# Patient Record
Sex: Male | Born: 1970 | Race: White | Hispanic: No | Marital: Married | Smoking: Former smoker
Health system: Southern US, Community
[De-identification: ages and names within clinical notes are randomized; demographics above are authoritative.]

## PROBLEM LIST (undated history)

## (undated) DIAGNOSIS — I10 Essential (primary) hypertension: Secondary | ICD-10-CM

## (undated) DIAGNOSIS — K222 Esophageal obstruction: Secondary | ICD-10-CM

## (undated) DIAGNOSIS — R1310 Dysphagia, unspecified: Secondary | ICD-10-CM

## (undated) DIAGNOSIS — I4510 Unspecified right bundle-branch block: Secondary | ICD-10-CM

## (undated) DIAGNOSIS — F411 Generalized anxiety disorder: Secondary | ICD-10-CM

## (undated) DIAGNOSIS — M109 Gout, unspecified: Secondary | ICD-10-CM

## (undated) DIAGNOSIS — J45909 Unspecified asthma, uncomplicated: Secondary | ICD-10-CM

## (undated) HISTORY — DX: Gout, unspecified: M10.9

## (undated) HISTORY — DX: Dysphagia, unspecified: R13.10

## (undated) HISTORY — DX: Generalized anxiety disorder: F41.1

## (undated) HISTORY — PX: UPPER GI ENDOSCOPY: SHX6162

## (undated) HISTORY — PX: TONSILLECTOMY: SUR1361

## (undated) HISTORY — DX: Unspecified asthma, uncomplicated: J45.909

## (undated) HISTORY — PX: ESOPHAGEAL DILATION: SHX303

## (undated) HISTORY — DX: Unspecified right bundle-branch block: I45.10

---

## 2002-05-29 ENCOUNTER — Ambulatory Visit (HOSPITAL_COMMUNITY): Admission: RE | Admit: 2002-05-29 | Discharge: 2002-05-29 | Payer: Self-pay | Admitting: Gastroenterology

## 2003-03-07 ENCOUNTER — Emergency Department (HOSPITAL_COMMUNITY): Admission: EM | Admit: 2003-03-07 | Discharge: 2003-03-07 | Payer: Self-pay | Admitting: Emergency Medicine

## 2003-03-16 ENCOUNTER — Emergency Department (HOSPITAL_COMMUNITY): Admission: EM | Admit: 2003-03-16 | Discharge: 2003-03-16 | Payer: Self-pay | Admitting: *Deleted

## 2005-05-23 ENCOUNTER — Ambulatory Visit (HOSPITAL_COMMUNITY): Admission: RE | Admit: 2005-05-23 | Discharge: 2005-05-23 | Payer: Self-pay | Admitting: Gastroenterology

## 2005-05-30 ENCOUNTER — Encounter: Admission: RE | Admit: 2005-05-30 | Discharge: 2005-08-28 | Payer: Self-pay | Admitting: Family Medicine

## 2011-02-01 ENCOUNTER — Other Ambulatory Visit: Payer: Self-pay | Admitting: Gastroenterology

## 2011-02-01 DIAGNOSIS — R1031 Right lower quadrant pain: Secondary | ICD-10-CM

## 2011-02-06 ENCOUNTER — Ambulatory Visit
Admission: RE | Admit: 2011-02-06 | Discharge: 2011-02-06 | Disposition: A | Discharge status: Home / Self Care | Payer: 59 | Source: Ambulatory Visit | Attending: Gastroenterology | Admitting: Gastroenterology

## 2011-02-06 DIAGNOSIS — R1031 Right lower quadrant pain: Secondary | ICD-10-CM

## 2011-10-05 ENCOUNTER — Emergency Department (HOSPITAL_COMMUNITY)
Admission: EM | Admit: 2011-10-05 | Discharge: 2011-10-05 | Discharge status: Against Medical Advice | Payer: 59 | Attending: Emergency Medicine | Admitting: Emergency Medicine

## 2011-10-05 DIAGNOSIS — I10 Essential (primary) hypertension: Secondary | ICD-10-CM | POA: Insufficient documentation

## 2011-10-05 DIAGNOSIS — R079 Chest pain, unspecified: Secondary | ICD-10-CM | POA: Insufficient documentation

## 2011-10-05 LAB — CBC
HCT: 43.2 % (ref 39.0–52.0)
Platelets: 226 10*3/uL (ref 150–400)
RBC: 4.82 MIL/uL (ref 4.22–5.81)
RDW: 12.5 % (ref 11.5–15.5)
WBC: 6 10*3/uL (ref 4.0–10.5)

## 2011-10-05 LAB — DIFFERENTIAL
Basophils Absolute: 0 10*3/uL (ref 0.0–0.1)
Eosinophils Relative: 2 % (ref 0–5)
Lymphocytes Relative: 27 % (ref 12–46)
Neutrophils Relative %: 62 % (ref 43–77)

## 2011-10-05 LAB — POCT I-STAT TROPONIN I
Troponin i, poc: 0 ng/mL (ref 0.00–0.08)
Troponin i, poc: 0 ng/mL (ref 0.00–0.08)

## 2011-10-05 LAB — COMPREHENSIVE METABOLIC PANEL
ALT: 70 U/L — ABNORMAL HIGH (ref 0–53)
BUN: 14 mg/dL (ref 6–23)
CO2: 27 mEq/L (ref 19–32)
Calcium: 9.6 mg/dL (ref 8.4–10.5)
Creatinine, Ser: 0.89 mg/dL (ref 0.50–1.35)
GFR calc Af Amer: >90 mL/min (ref >90)
GFR calc non Af Amer: >90 mL/min (ref >90)
Glucose, Bld: 92 mg/dL (ref 70–99)
Sodium: 134 mEq/L — ABNORMAL LOW (ref 135–145)
Total Protein: 7.9 g/dL (ref 6.0–8.3)

## 2011-10-06 ENCOUNTER — Other Ambulatory Visit: Payer: Self-pay | Admitting: Family Medicine

## 2011-10-11 ENCOUNTER — Ambulatory Visit
Admission: RE | Admit: 2011-10-11 | Discharge: 2011-10-11 | Disposition: A | Discharge status: Home / Self Care | Payer: 59 | Source: Ambulatory Visit | Attending: Family Medicine | Admitting: Family Medicine

## 2012-03-13 ENCOUNTER — Ambulatory Visit
Admission: RE | Admit: 2012-03-13 | Discharge: 2012-03-13 | Disposition: A | Discharge status: Home / Self Care | Payer: 59 | Source: Ambulatory Visit | Attending: Allergy | Admitting: Allergy

## 2012-03-13 ENCOUNTER — Other Ambulatory Visit: Payer: Self-pay | Admitting: Allergy

## 2012-03-13 DIAGNOSIS — R062 Wheezing: Secondary | ICD-10-CM

## 2013-06-06 ENCOUNTER — Emergency Department (HOSPITAL_COMMUNITY)
Admission: EM | Admit: 2013-06-06 | Discharge: 2013-06-06 | Disposition: A | Discharge status: Home / Self Care | Payer: 59 | Attending: Emergency Medicine | Admitting: Emergency Medicine

## 2013-06-06 ENCOUNTER — Emergency Department (HOSPITAL_COMMUNITY): Payer: 59

## 2013-06-06 ENCOUNTER — Encounter (HOSPITAL_COMMUNITY): Payer: Self-pay | Admitting: *Deleted

## 2013-06-06 DIAGNOSIS — Y9289 Other specified places as the place of occurrence of the external cause: Secondary | ICD-10-CM | POA: Insufficient documentation

## 2013-06-06 DIAGNOSIS — I10 Essential (primary) hypertension: Secondary | ICD-10-CM | POA: Insufficient documentation

## 2013-06-06 DIAGNOSIS — X500XXA Overexertion from strenuous movement or load, initial encounter: Secondary | ICD-10-CM | POA: Insufficient documentation

## 2013-06-06 DIAGNOSIS — S93602A Unspecified sprain of left foot, initial encounter: Secondary | ICD-10-CM

## 2013-06-06 DIAGNOSIS — Y9389 Activity, other specified: Secondary | ICD-10-CM | POA: Insufficient documentation

## 2013-06-06 DIAGNOSIS — Z79899 Other long term (current) drug therapy: Secondary | ICD-10-CM | POA: Insufficient documentation

## 2013-06-06 DIAGNOSIS — Z8719 Personal history of other diseases of the digestive system: Secondary | ICD-10-CM | POA: Insufficient documentation

## 2013-06-06 DIAGNOSIS — Z880 Allergy status to penicillin: Secondary | ICD-10-CM | POA: Insufficient documentation

## 2013-06-06 DIAGNOSIS — S93609A Unspecified sprain of unspecified foot, initial encounter: Secondary | ICD-10-CM | POA: Insufficient documentation

## 2013-06-06 DIAGNOSIS — Y990 Civilian activity done for income or pay: Secondary | ICD-10-CM | POA: Insufficient documentation

## 2013-06-06 HISTORY — DX: Esophageal obstruction: K22.2

## 2013-06-06 HISTORY — DX: Essential (primary) hypertension: I10

## 2013-06-06 MED ORDER — HYDROCODONE-ACETAMINOPHEN 5-325 MG PO TABS: 1.0000 | ORAL_TABLET | Freq: Four times a day (QID) | ORAL | Status: DC | PRN

## 2013-06-06 MED ORDER — IBUPROFEN 800 MG PO TABS
800.0000 mg | ORAL_TABLET | Freq: Once | ORAL | Status: AC
  Administered 2013-06-06: 800 mg via ORAL
  Filled 2013-06-06: qty 1

## 2013-06-06 MED ORDER — IBUPROFEN 800 MG PO TABS: 800.0000 mg | ORAL_TABLET | Freq: Three times a day (TID) | ORAL | Status: DC

## 2013-06-06 NOTE — ED Provider Notes (Signed)
History     CSN: [id number]  Arrival date & time 06/06/13  0415   First MD Initiated Contact with Patient 06/06/13 0505      Chief Complaint  Patient presents with  . Foot Pain    (Consider location/radiation/quality/duration/timing/severity/associated sxs/prior treatment) HPI Hx per PT - at work, was leaning over to pick something up and felt a pop in his left foot, about an hour later it started to hurt some and since then getting progressively worse now to the point of hurts to bear weight.  PT worried he may have sprained it. There is some swelling. No knee or ankle pain or injury otherwise. Pain sharp and mod in severity. He took Ibuprofen with minimal relief.  Past Medical History  Diagnosis Date  . Hypertension   . Esophageal stricture     History reviewed. No pertinent past surgical history.  No family history on file.  History  Substance Use Topics  . Smoking status: Never Smoker   . Smokeless tobacco: Not on file  . Alcohol Use: 1.8 oz/week    3 Cans of beer per week      Review of Systems  Constitutional: Negative for fever and chills.  HENT: Negative for neck pain and neck stiffness.   Eyes: Negative for pain.  Respiratory: Negative for shortness of breath.   Cardiovascular: Negative for chest pain.  Gastrointestinal: Negative for abdominal pain.  Genitourinary: Negative for dysuria.  Musculoskeletal: Negative for back pain.  Skin: Negative for rash.  Neurological: Negative for headaches.  All other systems reviewed and are negative.    Allergies  Penicillins  Home Medications   Current Outpatient Rx  Name  Route  Sig  Dispense  Refill  . ibuprofen (ADVIL,MOTRIN) 200 MG tablet   Oral   Take 800 mg by mouth every 6 (six) hours as needed for pain.         Marland Kitchen omeprazole (PRILOSEC) 20 MG capsule   Oral   Take 20 mg by mouth daily.           BP 146/88  Pulse 89  Temp(Src) 99.7 F (37.6 C)  Resp 20  SpO2 99%  Physical Exam   Constitutional: He is oriented to person, place, and time. He appears well-developed and well-nourished.  HENT:  Head: Normocephalic and atraumatic.  Eyes: EOM are normal. Pupils are equal, round, and reactive to light.  Neck: Neck supple.  Cardiovascular: Regular rhythm and intact distal pulses.   Pulmonary/Chest: Effort normal. No respiratory distress.  Musculoskeletal:  L foot TTP dorsal lateral aspect with moderate edema, no erythema or inc warmth, no tenderness over ankle or proximal fibula. distal pulses and N/V intact  Neurological: He is alert and oriented to person, place, and time.  Skin: Skin is warm and dry.    ED Course  Procedures (including critical care time)  Labs Reviewed - No data to display Dg Foot Complete Left  06/06/2013   *RADIOLOGY REPORT*  Clinical Data: Left proximal and dorsal foot pain.  No specific injury but heard something pop around midnight.  Pain since then.  LEFT FOOT - COMPLETE 3+ VIEW  Comparison: None.  Findings: Mild degenerative changes in the tarsal joints.  Left foot appears otherwise intact. No evidence of acute fracture or subluxation.  No focal bone lesions.  Bone matrix and cortex appear intact.  No abnormal radiopaque densities in the soft tissues. Small plantar calcaneal spur.  IMPRESSION: No acute bony abnormalities demonstrated the left foot.  Original Report Authenticated By: Burman Nieves, M.D.    Ice, elevation of LLE  No FX on xray - plan splint/ crutches and Ortho referral. Sprain and occult FX precautions provided.   MDM  L foot injury/ pain  Evaluated with xray reviewed as above  VS and nursing notes reviewed        Sunnie Nielsen, MD 06/06/13 [id number]

## 2013-06-06 NOTE — ED Notes (Signed)
Pt c/o left foot pain; leaned down at work and heard a pop sound; no pain at that time; developed more and more pain gradually since midnight; pain on top of foot radiating to heel; severe pain with walking

## 2014-02-02 ENCOUNTER — Encounter: Payer: Self-pay | Admitting: Podiatry

## 2014-02-02 ENCOUNTER — Ambulatory Visit (INDEPENDENT_AMBULATORY_CARE_PROVIDER_SITE_OTHER): Payer: 59 | Admitting: Podiatry

## 2014-02-02 ENCOUNTER — Ambulatory Visit (INDEPENDENT_AMBULATORY_CARE_PROVIDER_SITE_OTHER): Payer: 59

## 2014-02-02 VITALS — BP 159/102 | HR 79 | Resp 16 | Ht 75.0 in | Wt 250.0 lb

## 2014-02-02 DIAGNOSIS — M722 Plantar fascial fibromatosis: Secondary | ICD-10-CM

## 2014-02-02 DIAGNOSIS — M109 Gout, unspecified: Secondary | ICD-10-CM

## 2014-02-02 MED ORDER — TRIAMCINOLONE ACETONIDE 10 MG/ML IJ SUSP: 10.0000 mg | Freq: Once | INTRAMUSCULAR | Status: DC

## 2014-02-02 MED ORDER — DICLOFENAC SODIUM 75 MG PO TBEC: 75.0000 mg | DELAYED_RELEASE_TABLET | Freq: Two times a day (BID) | ORAL | Status: DC

## 2014-02-02 NOTE — Progress Notes (Signed)
Subjective:     Patient ID: Alexander Joseph, male   DOB: Feb 07, 1971, 43 y.o.   MRN: [id number]  Foot Pain   patient presents stating I'm having discomfort of a significant nature in the bottom of my right heel. I had a history of gout and I'm not sure if it to that but I did try medicine which did not help me and the pain is worse when I get up in the morning and after periods of sitting and has been lasting for at least 1 month   Review of Systems  All other systems reviewed and are negative.       Objective:   Physical Exam  Nursing note and vitals reviewed. Constitutional: He is oriented to person, place, and time.  Cardiovascular: Intact distal pulses.   Musculoskeletal: Normal range of motion.  Neurological: He is oriented to person, place, and time.  Skin: Skin is warm.   neurovascular status is intact with muscle strength adequate and mild equinus noted of both feet. Patient's not have discomfort plantar heel right at the insertional point of the tendon into the calcaneus with no erythema or edema present and no other joint swelling noted at this time     Assessment:     Probable plantar fasciitis right with no current involvement of gout    Plan:     H&P and x-rays reviewed. Reviewed doubt versus plantar fasciitis and today injected the plantar fascia 3 mg Kenalog 5 mg I can Marcaine mixture and applied fascially brace with instructions. Placed on Voltaren 75 mg twice a day and advised him reduced activity for the next several days and reappoint one week

## 2014-02-02 NOTE — Patient Instructions (Signed)

## 2014-02-02 NOTE — Progress Notes (Signed)
   Subjective:    Patient ID: Alexander RayaDavid M Joseph, male    DOB: 1971/09/19, 43 y.o.   MRN: [id number]  HPI Comments: "I thought it was gout but now I think its something else"  Patient states that he has sharp pain plantar heel right for 3.5 weeks now. Am pain and pain after sitting for a log period. He notices that its worse at work with walking a lot. Has tried stretching, rolling foot on ball, and padding. Nothing has helped. Does feel some arch pain occasionally. Pt has history of gout.  Foot Pain Associated symptoms include fatigue.      Review of Systems  Constitutional: Positive for fatigue.  Genitourinary: Positive for frequency.  Musculoskeletal: Positive for gait problem.  Allergic/Immunologic: Positive for environmental allergies.  All other systems reviewed and are negative.       Objective:   Physical Exam        Assessment & Plan:

## 2014-02-09 ENCOUNTER — Ambulatory Visit: Payer: 59 | Admitting: Podiatry

## 2014-02-11 ENCOUNTER — Ambulatory Visit: Payer: Self-pay | Admitting: Podiatry

## 2014-08-21 ENCOUNTER — Encounter: Payer: Self-pay | Admitting: *Deleted

## 2014-09-11 ENCOUNTER — Ambulatory Visit (INDEPENDENT_AMBULATORY_CARE_PROVIDER_SITE_OTHER): Payer: 59

## 2014-09-11 VITALS — BP 160/99 | HR 72 | Resp 16

## 2014-09-11 DIAGNOSIS — S99921A Unspecified injury of right foot, initial encounter: Secondary | ICD-10-CM

## 2014-09-11 DIAGNOSIS — S99919A Unspecified injury of unspecified ankle, initial encounter: Secondary | ICD-10-CM

## 2014-09-11 DIAGNOSIS — S92911A Unspecified fracture of right toe(s), initial encounter for closed fracture: Secondary | ICD-10-CM

## 2014-09-11 DIAGNOSIS — S9031XA Contusion of right foot, initial encounter: Secondary | ICD-10-CM

## 2014-09-11 DIAGNOSIS — S99929A Unspecified injury of unspecified foot, initial encounter: Secondary | ICD-10-CM

## 2014-09-11 DIAGNOSIS — S8990XA Unspecified injury of unspecified lower leg, initial encounter: Secondary | ICD-10-CM

## 2014-09-11 DIAGNOSIS — S9030XA Contusion of unspecified foot, initial encounter: Secondary | ICD-10-CM

## 2014-09-11 DIAGNOSIS — S92919A Unspecified fracture of unspecified toe(s), initial encounter for closed fracture: Secondary | ICD-10-CM

## 2014-09-11 NOTE — Patient Instructions (Signed)
ICE INSTRUCTIONS  Apply ice or cold pack to the affected area at least 3 times a day for 10-15 minutes each time.  You should also use ice after prolonged activity or vigorous exercise.  Do not apply ice longer than 20 minutes at one time.  Always keep a cloth between your skin and the ice pack to prevent burns.  Being consistent and following these instructions will help control your symptoms.  We suggest you purchase a gel ice pack because they are reusable and do bit leak.  Some of them are designed to wrap around the area.  Use the method that works best for you.  Here are some other suggestions for icing.   Use a frozen bag of peas or corn-inexpensive and molds well to your body, usually stays frozen for 10 to 20 minutes.  Wet a towel with cold water and squeeze out the excess until it's damp.  Place in a bag in the freezer for 20 minutes. Then remove and use.  Recommended Tylenol or Advil as needed for pain  Maintain Coflex buddy wrap of toes 3,4 and 5 need to be buddy wrap. Maintain wrapping on a daily basis for the next 2-3 weeks. Also maintain a stiff soled work boot or athletic shoe at all times. Avoid anything tight or constrictive or flimsy

## 2014-09-11 NOTE — Progress Notes (Signed)
   Subjective:    Patient ID: Alexander Joseph, male    DOB: 10/09/1971, 43 y.o.   MRN: [id number]  HPI Comments: "I injured my foot"  Patient presents today with new problem.  Patient c/o throbbing forefoot right stating that he slipped and fell 2 days ago and twisted his foot. The area is slightly swollen and he does have bruising forefoot and toes. He's been trying to keep weight on medial foot. He's tried advil and elevation-no help.  Foot Injury       Review of Systems  Musculoskeletal: Positive for gait problem.  All other systems reviewed and are negative.      Objective:   Physical Exam 43 year old white male well-developed well-nourished. History presents at this time 2 days ago injured his right foot third fourth and fifth toes mostly fourth and fifth toes are some ecchymosis and edema and swelling of the toes having a hard time putting his weight has to walk and climb c and along fifth metatarsal painful and symptomatic. X-rays taken at this time reveal a fracture of the fifth digit proximal phalanx base with slight transverse displacement on oblique view adductovarus rotated fourth and fifth digits noted cannot rule out some strain or sprain of the MTP joints as well as the fracture the fifth toe for proximal phalanx base. Neurovascular status otherwise intact unremarkable no other changes since seems in February of the shear continues to have some plantar fascial symptomology as well I benefit from orthoses in the future. Again neurovascular status intact pedal pulses palpable no open wounds or ulcers no secondary infection       Assessment & Plan:  Assessment contusion of toe with fracture fifth digit proximal fail buddy wrap is applied and multiple wraps are dispensed for daily application maintain buddy wrap in for the next 2-4 weeks as indicated also stiff shoe ice pack and Tylenol as needed for pain followup has not resolved within 4-6 weeks  Alvan Dame DPM

## 2014-09-14 ENCOUNTER — Ambulatory Visit: Payer: 59

## 2014-09-16 ENCOUNTER — Ambulatory Visit (INDEPENDENT_AMBULATORY_CARE_PROVIDER_SITE_OTHER): Payer: 59

## 2014-09-16 VITALS — BP 179/111 | HR 95 | Resp 16

## 2014-09-16 DIAGNOSIS — M109 Gout, unspecified: Secondary | ICD-10-CM

## 2014-09-16 DIAGNOSIS — M779 Enthesopathy, unspecified: Secondary | ICD-10-CM

## 2014-09-16 LAB — CBC WITH DIFFERENTIAL/PLATELET
BASOS ABS: 0.1 10*3/uL (ref 0.0–0.1)
Basophils Relative: 1 % (ref 0–1)
EOS ABS: 0.1 10*3/uL (ref 0.0–0.7)
Eosinophils Relative: 1 % (ref 0–5)
HCT: 46 % (ref 39.0–52.0)
Hemoglobin: 16.5 g/dL (ref 13.0–17.0)
LYMPHS ABS: 1.6 10*3/uL (ref 0.7–4.0)
LYMPHS PCT: 27 % (ref 12–46)
MCH: 31.9 pg (ref 26.0–34.0)
MCHC: 35.9 g/dL (ref 30.0–36.0)
MCV: 89 fL (ref 78.0–100.0)
Monocytes Absolute: 0.6 10*3/uL (ref 0.1–1.0)
Monocytes Relative: 10 % (ref 3–12)
NEUTROS PCT: 61 % (ref 43–77)
Neutro Abs: 3.7 10*3/uL (ref 1.7–7.7)
PLATELETS: 208 10*3/uL (ref 150–400)
RBC: 5.17 MIL/uL (ref 4.22–5.81)
RDW: 12.8 % (ref 11.5–15.5)
WBC: 6 10*3/uL (ref 4.0–10.5)

## 2014-09-16 LAB — RHEUMATOID FACTOR

## 2014-09-16 LAB — C-REACTIVE PROTEIN: CRP: <0.5 mg/dL (ref <0.60)

## 2014-09-16 LAB — URIC ACID: Uric Acid, Serum: 9.1 mg/dL — ABNORMAL HIGH (ref 4.0–7.8)

## 2014-09-16 MED ORDER — COLCHICINE 0.6 MG PO TABS: ORAL_TABLET | ORAL | Status: DC

## 2014-09-16 NOTE — Addendum Note (Signed)
Addended by: Kristian Covey on: 09/16/2014 02:53 PM   Modules accepted: Orders

## 2014-09-16 NOTE — Progress Notes (Signed)
   Subjective:    Patient ID: Alexander Joseph, male    DOB: [birth year], 43 y.o.   MRN: [id number]  HPI Comments: "I have a lot pain in this joint"  Patient c/o aching, throbbing 1st MPJ right for 2 days. He wondered if this was still something coming from the previous injury being treated for. The area is red and swollen. He is not able to put pressure on foot. Has not been able to work. He does have a history of gout. He has tried Advil-no relief.  Foot Pain Associated symptoms include arthralgias.      Review of Systems  Musculoskeletal: Positive for arthralgias and gait problem.  All other systems reviewed and are negative.      Objective:   Physical Exam 43 year old white male well-developed well-nourished oriented x3 presents at this time with a new problem last couple days his right great toe the painful symptomatic is not sure to compensatory gait orifice a gout flareup has had gout in the past previously treated with colchicine however he has no medication at current time. Doesn't keep it he's had some diarrhea possibly a stomach bug some sort last couple of days which may cause dehydration. Also been applying ice pack to the fracture fifth toe which may be loosening a gout attack as well.  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 Levaquin for arthritis panel and uric acid levels will be taken recheck in the next week or 2 for reevaluation contact us visiting changes or  exacerbations occur any failure to improve in the next couple days  Alvan Dame DPM

## 2014-09-16 NOTE — Patient Instructions (Signed)

## 2014-09-17 LAB — SEDIMENTATION RATE: SED RATE: 1 mm/h (ref 0–16)

## 2014-09-17 LAB — ANA: Anti Nuclear Antibody(ANA): NEGATIVE

## 2014-10-06 ENCOUNTER — Ambulatory Visit (INDEPENDENT_AMBULATORY_CARE_PROVIDER_SITE_OTHER): Payer: 59

## 2014-10-06 VITALS — BP 171/97 | HR 72 | Resp 15

## 2014-10-06 DIAGNOSIS — M109 Gout, unspecified: Secondary | ICD-10-CM

## 2014-10-06 DIAGNOSIS — M1000 Idiopathic gout, unspecified site: Secondary | ICD-10-CM

## 2014-10-06 DIAGNOSIS — S92911D Unspecified fracture of right toe(s), subsequent encounter for fracture with routine healing: Secondary | ICD-10-CM

## 2014-10-06 DIAGNOSIS — M779 Enthesopathy, unspecified: Secondary | ICD-10-CM

## 2014-10-06 NOTE — Patient Instructions (Signed)
Information for patients with Gout  Gout defined-Gout occurs when urate crystals accumulate in your joint causing the inflammation and intense pain of gout attack.  Urate crystals can form when you have high levels of uric acid in your blood.  Your body produces uric acid when it breaks down prurines-substances that are found naturally in your body, as well as in certain foods such as organ meats, anchioves, herring, asparagus, and mushrooms.  Normally uric acid dissolves in your blood and passes through your kidneys into your urine.  But sometimes your body either produces too much uric acid or your kidneys excrete too little uric acid.  When this happens, uric acid can build up, forming sharp needle-like urate crystals in a joint or surrounding tissue that cause pain, inflammation and swelling.    Gout is characterized by sudden, severe attacks of pain, redness and tenderness in joints, often the joint at the base of the big toe.  Gout is complex form of arthritis that can affect anyone.  Men are more likely to get gout but women become increasingly more susceptible to gout after menopause.  An acute attack of gout can wake you up in the middle of the night with the sensation that your big toe is on fire.  The affected joint is hot, swollen and so tender that even the weight or the sheet on it may seem intolerable.  If you experience symptoms of an acute gout attack it is important to your doctor as soon as the symptoms start.  Gout that goes untreated can lead to worsening pain and joint damage.  Risk Factors:  You are more likely to develop gout if you have high levels of uric acid in your body.    Factors that increase the uric acid level in your body include:  Lifestyle factors.  Excessive alcohol use-generally more than two drinks a day for men and more than one for women increase the risk of gout.  Medical conditions.  Certain conditions make it more likely that you will develop gout.   These include hypertension, and chronic conditions such as diabetes, high levels of fat and cholesterol in the blood, and narrowing of the arteries.  Certain medications.  The uses of Thiazide diuretics- commonly used to treat hypertension and low dose aspirin can also increase uric acid levels.  Family history of gout.  If other members of your family have had gout, you are more likely to develop the disease.  Age and sex. Gout occurs more often in men than it does in women, primarily because women tend to have lower uric acid levels than men do.  Men are more likely to develop gout earlier usually between the ages of 40-50- whereas women generally develop signs and symptoms after menopause.    Tests and diagnosis:  Tests to help diagnose gout may include:  Blood test.  Your doctor may recommend a blood test to measure the uric acid level in your blood .  Blood tests can be misleading, though.  Some people have high uric acid levels but never experience gout.  And some people have signs and symptoms of gout, but don't have unusual levels of uric acid in their blood.  Joint fluid test.  Your doctor may use a needle to draw fluid from your affected joint.  When examined under the microscope, your joint fluid may reveal urate crystals.  Treatment:  Treatment for gout usually involves medications.  What medications you and your doctor choose will be   based on your current health and other medications you currently take.  Gout medications can be used to treat acute gout attacks and prevent future attacks as well as reduce your risk of complications from gout such as the development of tophi from urate crystal deposits.  Alternative medicine:   Certain foods have been studied for their potential to lower uric acid levels, including:  Coffee.  Studies have found an association between coffee drinking (regular and decaf) and lower uric acid levels.  The evidence is not enough to encourage  non-coffee drinkers to start, but it may give clues to new ways of treating gout in the future.  Vitamin C.  Supplements containing vitamin C may reduce the levels of uric acid in your blood.  However, vitamin as a treatment for gout. Don't assume that if a little vitamin C is good, than lots is better.  Megadoses of vitamin C may increase your bodies uric acid levels.  Cherries.  Cherries have been associated with lower levels of uric acid in studies, but it isn't clear if they have any effect on gout signs and symptoms.  Eating more cherries and other dar-colored fruits, such as blackberries, blueberries, purple grapes and raspberries, may be a safe way to support your gout treatment.    Lifestyle/Diet Recommendations:   Drink 8 to 16 cups ( about 2 to 4 liters) of fluid each day, with at least half being water.  Avoid alcohol  Eat a moderate amount of protein, preferably from healthy sources, such as low-fat or fat-free dairy, tofu, eggs, and nut butters.  Limit you daily intake of meat, fish, and poultry to 4 to 6 ounces.  Avoid high fat meats and desserts.  Decrease you intake of shellfish, beef, lamb, pork, eggs and cheese.  Choose a good source of vitamin C daily such as citrus fruits, strawberries, broccoli,  brussel sprouts, papaya, and cantaloupe.   Choose a good source of vitamin A every other day such as yellow fruits, or dark green/yellow vegetables.  Avoid drastic weigh reduction or fasting.  If weigh loss is desired lose it over a period of several months.  See "dietary considerations.." chart for specific food recommendations.  Dietary Considerations for people with Gout  Food with negligible purine content (0-15 mg of purine nitrogen per 100 grams food)  May use as desired except on calorie variations  Non fat milk Cocoa Cereals (except in list II) Hard candies  Buttermilk Carbonated drinks Vegetables (except in list II) Sherbet  Coffee Fruits Sugar Honey  Tea  Cottage Cheese Gelatin-jell-o Salt  Fruit juice Breads Angel food Cake   Herbs/spices Jams/Jellies Carnation Instant Breakfast    Foods that do not contain excessive purine content, but must be limited due to fat content  Cream Eggs Oil and Salad Dressing  Half and Half Peanut Butter Chocolate  Whole Milk Cakes Potato Chips  Butter Ice Cream Fried Foods  Cheese Nuts Waffles, pancakes   List II: Food with moderate purine content (50-150 mg of purine nitrogen per 100 grams of food)  Limit total amount each day to 5 oz. cooked Lean meat, other than those on list III   Poultry, other than those on list III Fish, other than those on list III   Seafood, other than those on list III  These foods may be used occasionally  Peas Lentils Bran  Spinach Oatmeal Dried Beans and Peas  Asparagus Wheat Germ Mushrooms   Additional information about meat choices  Choose fish   and poultry, particularly without skin, often.  Select lean, well trimmed cuts of meat.  Avoid all fatty meats, bacon , sausage, fried meats, fried fish, or poultry, luncheon meats, cold cuts, hot dogs, meats canned or frozen in gravy, spareribs and frozen and packaged prepared meats.   List III: Foods with HIGH purine content / Foods to AVOID (150-800 mg of purine nitrogen per 100 grams of food)  Anchovies Herring Meat Broths  Liver Mackerel Meat Extracts  Kidney Scallops Meat Drippings  Sardines Wild Game Mincemeat  Sweetbreads Goose Gravy  Heart Tongue Yeast, baker's and brewers   Commercial soups made with any of the foods listed in List II or List III  In addition avoid all alcoholic beverages     Recommended discuss with her primary physician preventive medications such as Uloric or allopurinol. These medicines can help lower your uric acid level and prevent a gout attack, rather than colchicine was just reduces the pain level to

## 2014-10-06 NOTE — Progress Notes (Signed)
   Subjective:    Patient ID: Alexander Joseph, male    DOB: [birth year], 43 y.o.   MRN: [id number]  HPI Comments: Pt states he is some better, and would like to know his lab results.     Review of Systems no new findings or systemic changes noted labs reviewed and reveals elevated uric acid levels 9.1     Objective:   Physical Exam Neurovascular status is intact pedal pulses are palpable patient's lab revealed you uric acid levels 9.1 however is currently doing much better on colchicine taking one pill every other day or intermittently. Patient scheduled for followup with primary physician near future recommended that he discuss the lower or allopurinol as prevented medication. At this time patient fifth toe which her fracture appears to be doing well still some slight tenderness and swelling although not as intense as it had been several weeks ago normal crit healing of the toes noted neurovascular status intact pedal pulses are palpable no other new problem 3 shoes noted at this time.       Assessment & Plan:  Assessment resolving gout attack treated with colchicine recommended these colchicine as needed for a flareups however in the future followup with primary physician will seek definitive medication such as allopurinol or Uloric.  Followup in the future as needed  Alvan Dameichard Josemaria Brining DPM

## 2015-07-01 ENCOUNTER — Telehealth: Payer: Self-pay | Admitting: *Deleted

## 2015-07-01 NOTE — Telephone Encounter (Signed)
Pt's wife, Selena BattenKim called for refill of pt's medication for gout.  I reviewed pt's chart and pt was last seen 09/2014 by Dr. Ralene CorkSikora.  I left a message informing the wife her husband would need to be seen by a doctor prior to refill.

## 2016-02-29 ENCOUNTER — Emergency Department (HOSPITAL_COMMUNITY): Payer: 59

## 2016-02-29 ENCOUNTER — Encounter (HOSPITAL_COMMUNITY): Payer: Self-pay | Admitting: *Deleted

## 2016-02-29 DIAGNOSIS — Z87891 Personal history of nicotine dependence: Secondary | ICD-10-CM | POA: Insufficient documentation

## 2016-02-29 DIAGNOSIS — J45909 Unspecified asthma, uncomplicated: Secondary | ICD-10-CM | POA: Insufficient documentation

## 2016-02-29 DIAGNOSIS — Z8719 Personal history of other diseases of the digestive system: Secondary | ICD-10-CM | POA: Insufficient documentation

## 2016-02-29 DIAGNOSIS — R0789 Other chest pain: Secondary | ICD-10-CM | POA: Insufficient documentation

## 2016-02-29 DIAGNOSIS — Z791 Long term (current) use of non-steroidal anti-inflammatories (NSAID): Secondary | ICD-10-CM | POA: Diagnosis not present

## 2016-02-29 DIAGNOSIS — I10 Essential (primary) hypertension: Secondary | ICD-10-CM | POA: Diagnosis not present

## 2016-02-29 DIAGNOSIS — Z880 Allergy status to penicillin: Secondary | ICD-10-CM | POA: Diagnosis not present

## 2016-02-29 DIAGNOSIS — M109 Gout, unspecified: Secondary | ICD-10-CM | POA: Insufficient documentation

## 2016-02-29 DIAGNOSIS — Z8659 Personal history of other mental and behavioral disorders: Secondary | ICD-10-CM | POA: Insufficient documentation

## 2016-02-29 DIAGNOSIS — R079 Chest pain, unspecified: Secondary | ICD-10-CM | POA: Diagnosis present

## 2016-02-29 LAB — CBC
HEMATOCRIT: 43 % (ref 39.0–52.0)
HEMOGLOBIN: 15 g/dL (ref 13.0–17.0)
MCH: 31.3 pg (ref 26.0–34.0)
MCHC: 34.9 g/dL (ref 30.0–36.0)
MCV: 89.8 fL (ref 78.0–100.0)
Platelets: 198 10*3/uL (ref 150–400)
RBC: 4.79 MIL/uL (ref 4.22–5.81)
RDW: 12.7 % (ref 11.5–15.5)
WBC: 5.8 10*3/uL (ref 4.0–10.5)

## 2016-02-29 LAB — I-STAT TROPONIN, ED: Troponin i, poc: 0.01 ng/mL (ref 0.00–0.08)

## 2016-02-29 LAB — BASIC METABOLIC PANEL
ANION GAP: 11 (ref 5–15)
BUN: 10 mg/dL (ref 6–20)
CHLORIDE: 104 mmol/L (ref 101–111)
CO2: 24 mmol/L (ref 22–32)
Calcium: 9.8 mg/dL (ref 8.9–10.3)
Creatinine, Ser: 1.06 mg/dL (ref 0.61–1.24)
GFR calc non Af Amer: >60 mL/min (ref >60)
Glucose, Bld: 98 mg/dL (ref 65–99)
Potassium: 4.1 mmol/L (ref 3.5–5.1)
Sodium: 139 mmol/L (ref 135–145)

## 2016-02-29 NOTE — ED Notes (Signed)
Pt c/o left sided chest pain with radiation to his back since Wednesday or Thursday. Denies hx of similar pain.

## 2016-03-01 ENCOUNTER — Emergency Department (HOSPITAL_COMMUNITY)
Admission: EM | Admit: 2016-03-01 | Discharge: 2016-03-01 | Disposition: A | Discharge status: Home / Self Care | Payer: 59 | Attending: Emergency Medicine | Admitting: Emergency Medicine

## 2016-03-01 DIAGNOSIS — R079 Chest pain, unspecified: Secondary | ICD-10-CM

## 2016-03-01 LAB — I-STAT TROPONIN, ED: Troponin i, poc: 0 ng/mL (ref 0.00–0.08)

## 2016-03-01 MED ORDER — IBUPROFEN 800 MG PO TABS: 800.0000 mg | ORAL_TABLET | Freq: Three times a day (TID) | ORAL | Status: DC | PRN

## 2016-03-01 MED ORDER — IBUPROFEN 800 MG PO TABS
800.0000 mg | ORAL_TABLET | Freq: Once | ORAL | Status: AC
  Administered 2016-03-01: 800 mg via ORAL
  Filled 2016-03-01: qty 1

## 2016-03-01 MED ORDER — HYDROCODONE-ACETAMINOPHEN 5-325 MG PO TABS
2.0000 | ORAL_TABLET | Freq: Once | ORAL | Status: AC
  Administered 2016-03-01: 2 via ORAL
  Filled 2016-03-01: qty 2

## 2016-03-01 MED ORDER — HYDROCODONE-ACETAMINOPHEN 5-325 MG PO TABS: 1.0000 | ORAL_TABLET | ORAL | Status: DC | PRN

## 2016-03-01 NOTE — Discharge Instructions (Signed)
Nonspecific Chest Pain  °Chest pain can be caused by many different conditions. There is always a chance that your pain could be related to something serious, such as a heart attack or a blood clot in your lungs. Chest pain can also be caused by conditions that are not life-threatening. If you have chest pain, it is very important to follow up with your health care provider. °CAUSES  °Chest pain can be caused by: °· Heartburn. °· Pneumonia or bronchitis. °· Anxiety or stress. °· Inflammation around your heart (pericarditis) or lung (pleuritis or pleurisy). °· A blood clot in your lung. °· A collapsed lung (pneumothorax). It can develop suddenly on its own (spontaneous pneumothorax) or from trauma to the chest. °· Shingles infection (varicella-zoster virus). °· Heart attack. °· Damage to the bones, muscles, and cartilage that make up your chest wall. This can include: °¨ Bruised bones due to injury. °¨ Strained muscles or cartilage due to frequent or repeated coughing or overwork. °¨ Fracture to one or more ribs. °¨ Sore cartilage due to inflammation (costochondritis). °RISK FACTORS  °Risk factors for chest pain may include: °· Activities that increase your risk for trauma or injury to your chest. °· Respiratory infections or conditions that cause frequent coughing. °· Medical conditions or overeating that can cause heartburn. °· Heart disease or family history of heart disease. °· Conditions or health behaviors that increase your risk of developing a blood clot. °· Having had chicken pox (varicella zoster). °SIGNS AND SYMPTOMS °Chest pain can feel like: °· Burning or tingling on the surface of your chest or deep in your chest. °· Crushing, pressure, aching, or squeezing pain. °· Dull or sharp pain that is worse when you move, cough, or take a deep breath. °· Pain that is also felt in your back, neck, shoulder, or arm, or pain that spreads to any of these areas. °Your chest pain may come and go, or it may stay  constant. °DIAGNOSIS °Lab tests or other studies may be needed to find the cause of your pain. Your health care provider may have you take a test called an ambulatory ECG (electrocardiogram). An ECG records your heartbeat patterns at the time the test is performed. You may also have other tests, such as: °· Transthoracic echocardiogram (TTE). During echocardiography, sound waves are used to create a picture of all of the heart structures and to look at how blood flows through your heart. °· Transesophageal echocardiogram (TEE). This is a more advanced imaging test that obtains images from inside your body. It allows your health care provider to see your heart in finer detail. °· Cardiac monitoring. This allows your health care provider to monitor your heart rate and rhythm in real time. °· Holter monitor. This is a portable device that records your heartbeat and can help to diagnose abnormal heartbeats. It allows your health care provider to track your heart activity for several days, if needed. °· Stress tests. These can be done through exercise or by taking medicine that makes your heart beat more quickly. °· Blood tests. °· Imaging tests. °TREATMENT  °Your treatment depends on what is causing your chest pain. Treatment may include: °· Medicines. These may include: °¨ Acid blockers for heartburn. °¨ Anti-inflammatory medicine. °¨ Pain medicine for inflammatory conditions. °¨ Antibiotic medicine, if an infection is present. °¨ Medicines to dissolve blood clots. °¨ Medicines to treat coronary artery disease. °· Supportive care for conditions that do not require medicines. This may include: °¨ Resting. °¨ Applying heat   or cold packs to injured areas. °¨ Limiting activities until pain decreases. °HOME CARE INSTRUCTIONS °· If you were prescribed an antibiotic medicine, finish it all even if you start to feel better. °· Avoid any activities that bring on chest pain. °· Do not use any tobacco products, including  cigarettes, chewing tobacco, or electronic cigarettes. If you need help quitting, ask your health care provider. °· Do not drink alcohol. °· Take medicines only as directed by your health care provider. °· Keep all follow-up visits as directed by your health care provider. This is important. This includes any further testing if your chest pain does not go away. °· If heartburn is the cause for your chest pain, you may be told to keep your head raised (elevated) while sleeping. This reduces the chance that acid will go from your stomach into your esophagus. °· Make lifestyle changes as directed by your health care provider. These may include: °¨ Getting regular exercise. Ask your health care provider to suggest some activities that are safe for you. °¨ Eating a heart-healthy diet. A registered dietitian can help you to learn healthy eating options. °¨ Maintaining a healthy weight. °¨ Managing diabetes, if necessary. °¨ Reducing stress. °SEEK MEDICAL CARE IF: °· Your chest pain does not go away after treatment. °· You have a rash with blisters on your chest. °· You have a fever. °SEEK IMMEDIATE MEDICAL CARE IF:  °· Your chest pain is worse. °· You have an increasing cough, or you cough up blood. °· You have severe abdominal pain. °· You have severe weakness. °· You faint. °· You have chills. °· You have sudden, unexplained chest discomfort. °· You have sudden, unexplained discomfort in your arms, back, neck, or jaw. °· You have shortness of breath at any time. °· You suddenly start to sweat, or your skin gets clammy. °· You feel nauseous or you vomit. °· You suddenly feel light-headed or dizzy. °· Your heart begins to beat quickly, or it feels like it is skipping beats. °These symptoms may represent a serious problem that is an emergency. Do not wait to see if the symptoms will go away. Get medical help right away. Call your local emergency services (911 in the [place] Do not drive yourself to the hospital. °  °This  information is not intended to replace advice given to you by your health care provider. Make sure you discuss any questions you have with your health care provider. °  °Document Released: 09/20/2005 Document Revised: 01/01/2015 Document Reviewed: 07/17/2014 °Elsevier Interactive Patient Education ©2016 [employer] ° °

## 2016-03-01 NOTE — ED Notes (Signed)
Patient verbalized understanding of discharge instructions and denies any further needs or questions at this time. VS stable. Patient ambulatory with steady gait.  

## 2016-03-01 NOTE — ED Provider Notes (Signed)
TIME SEEN: 4:00 AM  CHIEF COMPLAINT:  Chest pain  HPI:  Patient is a 45 year old male with history of hypertension and previous tobacco use who quit over 15 years ago who presents to the emergency department complaints of left sided chest pain and left-sided upper back pain. Describes the pain as a "knot". It is not exertional or pleuritic. History Advil at home without relief. No known aggravating or relieving factors. Denies shortness of breath, diaphoresis, nausea or vomiting, fevers or cough. Did state that he felt lightheaded earlier when walking but this is now gone. Has had a previous stress test 6 years ago. Does have a family history of mother and father with coronary artery disease. No history of cardiac catheterization. No history of PE, DVT, exogenous estrogen use, fracture, surgery, trauma, hospitalization, prolonged travel. No lower extremity swelling or pain. No calf tenderness.   ROS: See HPI Constitutional: no fever  Eyes: no drainage  ENT: no runny nose   Cardiovascular:   chest pain  Resp: no SOB  GI: no vomiting GU: no dysuria Integumentary: no rash  Allergy: no hives  Musculoskeletal: no leg swelling  Neurological: no slurred speech ROS otherwise negative  PAST MEDICAL HISTORY/PAST SURGICAL HISTORY:  Past Medical History  Diagnosis Date  . Hypertension   . Esophageal stricture   . Anxiety state, unspecified   . Gout   . Asthma   . Dysphagia     MEDICATIONS:  Prior to Admission medications   Medication Sig Start Date End Date Taking? Authorizing Provider  colchicine 0.6 MG tablet Take 1 tablet twice daily for the first 3 days then 1 daily as needed for pain 09/16/14   Alvan Dame, DPM  diclofenac (VOLTAREN) 75 MG EC tablet Take 1 tablet (75 mg total) by mouth 2 (two) times daily. 02/02/14   Lenn Sink, DPM    ALLERGIES:  Allergies  Allergen Reactions  . Dust Mite Extract   . Penicillins Nausea Only  . Pollen Extract     SOCIAL HISTORY:  Social  History  Substance Use Topics  . Smoking status: Former Games developer  . Smokeless tobacco: Not on file  . Alcohol Use: 25.2 oz/week    42 Cans of beer per week    FAMILY HISTORY: Family History  Problem Relation Age of Onset  . Heart disease Father   . Gout Father   . Obesity Mother   . Osteoarthritis Mother   . Colon cancer Neg Hx   . Breast cancer Neg Hx   . Prostate cancer Neg Hx   . Asthma      EXAM: BP 134/84 mmHg  Pulse 63  Temp(Src) 98.1 F (36.7 C) (Oral)  Resp 18  Ht  (1.905 m)  Wt 250 lb (113.399 kg)  BMI 31.25 kg/m2  SpO2 99% CONSTITUTIONAL: Alert and oriented and responds appropriately to questions. Well-appearing; well-nourished HEAD: Normocephalic EYES: Conjunctivae clear, PERRL ENT: normal nose; no rhinorrhea; moist mucous membranes NECK: Supple, no meningismus, no LAD  CARD: RRR; S1 and S2 appreciated; no murmurs, no clicks, no rubs, no gallops CHEST:   Tender to palpation over the left anterior, lateral and posterior 8- 9  Rib which reproduces his pain with no ecchymosis, crepitus, deformity. No rash appreciated. RESP: Normal chest excursion without splinting or tachypnea; breath sounds clear and equal bilaterally; no wheezes, no rhonchi, no rales, no hypoxia or respiratory distress, speaking full sentences ABD/GI: Normal bowel sounds; non-distended; soft, non-tender, no rebound, no guarding, no peritoneal signs BACK:  The back appears normal and is non-tender to palpation, there is no CVA tenderness EXT: Normal ROM in all joints; non-tender to palpation; no edema; normal capillary refill; no cyanosis, no calf tenderness or swelling    SKIN: Normal color for age and race; warm; no rash NEURO: Moves all extremities equally, sensation to light touch intact diffusely, cranial nerves II through XII intact PSYCH: The patient's mood and manner are appropriate. Grooming and personal hygiene are appropriate.  MEDICAL DECISION MAKING:  Patient here with chest pain  for the past week. Has been constant for the past several days. It is not exertional or pleuritic. He is hemodynamically stable. EKG shows no ischemic changes compared to prior in 2012. First troponin negative. Pain is reproducible with palpation. Suspect this is musculoskeletal. He has no risk factors for PE other than age. Doubt dissection. Chest x-ray shows no widened mediastinum, cardiomegaly. No edema or pneumothorax. No infiltrate. Will obtain second troponin. Will give ibuprofen and Vicodin.  ED PROGRESS:  Patient's second troponin is negative. I feel he is safe to be discharged home. He does have a primary care physician, Dr[facility] Modesto [facility]. I recommended close outpatient follow-up for stress test. Discussed return precautions. Patient and wife verbalize understanding and are comfortable with this plan. We'll discharge with work note.    EKG Interpretation  Date/Time:  Tuesday February 29 2016 20:05:43 EST Ventricular Rate:  84 PR Interval:  156 QRS Duration: 94 QT Interval:  366 QTC Calculation: 432 R Axis:   75 Text Interpretation:  Sinus rhythm with marked sinus arrhythmia with occasional Premature ventricular complexes Incomplete right bundle branch block Borderline ECG No significant change since last tracing in 2012 Confirmed by Odean Mcelwain,  DO, Naviyah Schaffert [phone]) on 03/01/2016 3:59:16 AM        Layla MawKristen N Caedan Sumler, DO 03/01/16 [id number]

## 2016-03-13 ENCOUNTER — Encounter: Payer: Self-pay | Admitting: *Deleted

## 2016-03-13 DIAGNOSIS — F32A Depression, unspecified: Secondary | ICD-10-CM | POA: Insufficient documentation

## 2016-03-13 DIAGNOSIS — F419 Anxiety disorder, unspecified: Secondary | ICD-10-CM | POA: Insufficient documentation

## 2016-03-13 DIAGNOSIS — F329 Major depressive disorder, single episode, unspecified: Secondary | ICD-10-CM | POA: Insufficient documentation

## 2016-03-13 DIAGNOSIS — K219 Gastro-esophageal reflux disease without esophagitis: Secondary | ICD-10-CM | POA: Insufficient documentation

## 2016-03-13 DIAGNOSIS — T17320A Food in larynx causing asphyxiation, initial encounter: Secondary | ICD-10-CM | POA: Insufficient documentation

## 2016-03-13 DIAGNOSIS — R748 Abnormal levels of other serum enzymes: Secondary | ICD-10-CM | POA: Insufficient documentation

## 2016-03-13 DIAGNOSIS — I10 Essential (primary) hypertension: Secondary | ICD-10-CM | POA: Insufficient documentation

## 2016-03-13 DIAGNOSIS — E669 Obesity, unspecified: Secondary | ICD-10-CM | POA: Insufficient documentation

## 2016-03-13 DIAGNOSIS — K222 Esophageal obstruction: Secondary | ICD-10-CM | POA: Insufficient documentation

## 2016-03-13 DIAGNOSIS — R5383 Other fatigue: Secondary | ICD-10-CM | POA: Insufficient documentation

## 2016-03-13 DIAGNOSIS — N529 Male erectile dysfunction, unspecified: Secondary | ICD-10-CM | POA: Insufficient documentation

## 2016-03-13 DIAGNOSIS — M109 Gout, unspecified: Secondary | ICD-10-CM | POA: Insufficient documentation

## 2016-03-16 ENCOUNTER — Other Ambulatory Visit: Payer: Self-pay | Admitting: *Deleted

## 2016-03-16 ENCOUNTER — Encounter: Payer: Self-pay | Admitting: Cardiology

## 2016-03-16 ENCOUNTER — Ambulatory Visit (INDEPENDENT_AMBULATORY_CARE_PROVIDER_SITE_OTHER): Payer: 59 | Admitting: Cardiology

## 2016-03-16 VITALS — BP 130/82 | HR 78 | Ht 75.0 in | Wt 256.0 lb

## 2016-03-16 DIAGNOSIS — I450 Right fascicular block: Secondary | ICD-10-CM | POA: Diagnosis not present

## 2016-03-16 DIAGNOSIS — K219 Gastro-esophageal reflux disease without esophagitis: Secondary | ICD-10-CM | POA: Diagnosis not present

## 2016-03-16 DIAGNOSIS — R079 Chest pain, unspecified: Secondary | ICD-10-CM | POA: Diagnosis not present

## 2016-03-16 DIAGNOSIS — I4510 Unspecified right bundle-branch block: Secondary | ICD-10-CM | POA: Insufficient documentation

## 2016-03-16 HISTORY — DX: Unspecified right bundle-branch block: I45.10

## 2016-03-16 LAB — D-DIMER, QUANTITATIVE: D-Dimer, Quant: <0.27 ug/mL-FEU (ref 0.00–0.50)

## 2016-03-16 NOTE — Progress Notes (Signed)
Cardiology Office Note   Date:  03/16/2016   ID:  Alexander Joseph, DOB 06/13/1971, MRN [id number]  PCP:  Redmond Baseman, MD    Chief Complaint  Patient presents with  . New Patient (Initial Visit)    medication list reviewed.   Patient states that he is having issues with chest pain. He also feels a knot in his back and under his arm.   Went to ED and PCP and they felt like he was struggling with GERD, this has not relieved with medication so he was sent here to make sure cardiac is okay.       History of Present Illness: Alexander Joseph is a 45 y.o. male who presents for evaluation of chest pain.  This has been going on for about 3-4 weeks.  He thought he had pulled a muscle but the pain became progressive.  It started getting a worse at work and he went to [facility] ER and workup was normal including cardiac enzymes and EKG.  He saw his PCP who thought it was GERD with esophageal spasm and was placed on Nexium which did not improve his symptoms.  He describes the pain as a sharp shooting pain that is intermittent but also has a sensation that he has a knot in his back in between his shoulder blades that is a pressure.  The sharp pain has resolved but he now has a pressure over his left chest that comes and goes and can wake him up at night.  He admits to nausea and diaphoresis with the pain.  It does not radiated except to his back and shoulder blade.  He has also had some abdominal pain.  He denies any SOB or DOE but occasionally feels lightheaded.  He denies any belching or sour taste in his mouth in the am.  He is a remote smoker and quit 15 years ago.  His dad had an MI in his late 86's.      Past Medical History  Diagnosis Date  . Hypertension   . Esophageal stricture   . Anxiety state, unspecified   . Gout   . Asthma   . Dysphagia     Past Surgical History  Procedure Laterality Date  . Upper gi endoscopy    . Esophageal dilation    . Tonsillectomy        Current Outpatient Prescriptions  Medication Sig Dispense Refill  . allopurinol (ZYLOPRIM) 100 MG tablet Take 100 mg by mouth as needed (For Gout).     Marland Kitchen esomeprazole (NEXIUM) 20 MG capsule Take 20 mg by mouth 2 (two) times daily.    Marland Kitchen ibuprofen (ADVIL,MOTRIN) 800 MG tablet Take 800 mg by mouth as needed. Mild to moderate pain  0  . lisinopril (PRINIVIL,ZESTRIL) 10 MG tablet Take 10 mg by mouth daily.  0  . loratadine (CLARITIN) 10 MG tablet Take 10 mg by mouth daily.    Marland Kitchen VIAGRA 100 MG tablet Take 100 mg by mouth as needed for erectile dysfunction.   6   No current facility-administered medications for this visit.    Allergies:   Dust mite extract; Penicillins; and Pollen extract    Social History:  The patient  reports that he has quit smoking. He does not have any smokeless tobacco history on file. He reports that he drinks about 25.2 oz of alcohol per week. He reports that he  does not use illicit drugs.   Family History:  The patient's family history includes Gout in his father; Heart attack in his father; Heart disease in his father; Obesity in his mother; Osteoarthritis in his mother. There is no history of Colon cancer, Breast cancer, or Prostate cancer.    ROS:  Please see the history of present illness.   Otherwise, review of systems are positive for none.   All other systems are reviewed and negative.    PHYSICAL EXAM: VS:  BP 130/82 mmHg  Pulse 78  Ht 6\' 3"  (1.905 m)  Wt 256 lb (116.121 kg)  BMI 32.00 kg/m2 , BMI Body mass index is 32 kg/(m^2). GEN: Well nourished, well developed, in no acute distress HEENT: normal Neck: no JVD, carotid bruits, or masses Cardiac: RRR; no murmurs, rubs, or gallops,no edema  Respiratory:  clear to auscultation bilaterally, normal work of breathing GI: soft, nontender, nondistended, + BS MS: no deformity or atrophy Skin: warm and dry, no rash Neuro:  Strength and sensation are intact Psych: euthymic mood, full affect   EKG:   EKG is not ordered today. The ek   Recent Labs: 02/29/2016: BUN 10; Creatinine, Ser 1.06; Hemoglobin 15.0; Platelets 198; Potassium 4.1; Sodium 139    Lipid Panel No results found for: CHOL, TRIG, HDL, CHOLHDL, VLDL, LDLCALC, LDLDIRECT    Wt Readings from Last 3 Encounters:  03/16/16 256 lb (116.121 kg)  02/29/16 250 lb (113.399 kg)  02/02/14 250 lb (113.399 kg)       ASSESSMENT AND PLAN:  1.  Chest pain with atypical and typical components.  His symptoms sound more like GERD but he has multiple CRFs including remote tobacco use, age > 2240, males sex and strong family history of premature CAD. I wil get a 2D echo to assess LVF and ETT to rule out ischemia.  Will check FLP to assess risk.  I will also get a chest CT for calcium score for risk stratification.  Check D-Dimer.   2.  GERD with history of esophageal stricture s/p remote dilatation x 2 3.  Abnormal EKG with incomplete RBBB - check 2D echo   Current medicines are reviewed at length with the patient today.  The patient does not have concerns regarding medicines.  The following changes have been made:  no change  Labs/ tests ordered today: See above Assessment and Plan No orders of the defined types were placed in this encounter.     Disposition:   FU with me PRN pending results of studies  Signed, Quintella ReichertURNER,Levana Minetti R, MD  03/16/2016 8:40 AM    [facility] [address], [place], [place]  [address] Phone: [phone]) [phone]; Fax: [phone]

## 2016-03-16 NOTE — Addendum Note (Signed)
Addended by: Tonita PhoenixBOWDEN, Sara Selvidge K on: 03/16/2016 09:21 AM   Modules accepted: Orders

## 2016-03-16 NOTE — Patient Instructions (Addendum)
Medication Instructions:  Your physician recommends that you continue on your current medications as directed. Please refer to the Current Medication list given to you today.   Labwork: TODAY: DDIMER  Your physician recommends that you return for FASTING lab work same day as stress test.  Testing/Procedures: Your physician has requested that you have an echocardiogram. Echocardiography is a painless test that uses sound waves to create images of your heart. It provides your doctor with information about the size and shape of your heart and how well your heart's chambers and valves are working. This procedure takes approximately one hour. There are no restrictions for this procedure.  Your physician has requested that you have an exercise tolerance test. For further information please visit [email or web address]. Please also follow instruction sheet, as given.  Dr. Mayford Knifeurner recommends you have a CALCIUM SCORE.  Follow-Up: Your physician recommends that you schedule a follow-up appointment AS NEEDED with Dr. Mayford Knifeurner pending study results.  Any Other Special Instructions Will Be Listed Below (If Applicable).     If you need a refill on your cardiac medications before your next appointment, please call your pharmacy.

## 2016-03-30 ENCOUNTER — Other Ambulatory Visit: Payer: Self-pay | Admitting: Gastroenterology

## 2016-04-03 ENCOUNTER — Encounter (HOSPITAL_COMMUNITY): Payer: Self-pay | Admitting: *Deleted

## 2016-04-04 ENCOUNTER — Ambulatory Visit (HOSPITAL_COMMUNITY): Payer: 59 | Attending: Cardiology

## 2016-04-04 ENCOUNTER — Other Ambulatory Visit (INDEPENDENT_AMBULATORY_CARE_PROVIDER_SITE_OTHER): Payer: 59

## 2016-04-04 ENCOUNTER — Ambulatory Visit (INDEPENDENT_AMBULATORY_CARE_PROVIDER_SITE_OTHER)
Admission: RE | Admit: 2016-04-04 | Discharge: 2016-04-04 | Disposition: A | Discharge status: Home / Self Care | Payer: 59 | Source: Ambulatory Visit | Attending: Cardiology | Admitting: Cardiology

## 2016-04-04 ENCOUNTER — Ambulatory Visit (INDEPENDENT_AMBULATORY_CARE_PROVIDER_SITE_OTHER): Payer: 59

## 2016-04-04 ENCOUNTER — Other Ambulatory Visit: Payer: Self-pay

## 2016-04-04 DIAGNOSIS — I10 Essential (primary) hypertension: Secondary | ICD-10-CM | POA: Insufficient documentation

## 2016-04-04 DIAGNOSIS — Z87891 Personal history of nicotine dependence: Secondary | ICD-10-CM | POA: Insufficient documentation

## 2016-04-04 DIAGNOSIS — I4510 Unspecified right bundle-branch block: Secondary | ICD-10-CM | POA: Diagnosis not present

## 2016-04-04 DIAGNOSIS — Z8249 Family history of ischemic heart disease and other diseases of the circulatory system: Secondary | ICD-10-CM | POA: Diagnosis not present

## 2016-04-04 DIAGNOSIS — R079 Chest pain, unspecified: Secondary | ICD-10-CM

## 2016-04-04 DIAGNOSIS — J45909 Unspecified asthma, uncomplicated: Secondary | ICD-10-CM | POA: Insufficient documentation

## 2016-04-04 DIAGNOSIS — I071 Rheumatic tricuspid insufficiency: Secondary | ICD-10-CM | POA: Insufficient documentation

## 2016-04-04 DIAGNOSIS — R748 Abnormal levels of other serum enzymes: Secondary | ICD-10-CM

## 2016-04-04 LAB — EXERCISE TOLERANCE TEST
CHL CUP RESTING HR STRESS: 81 {beats}/min
CHL CUP STRESS STAGE 1 DBP: 82 mmHg
CHL CUP STRESS STAGE 1 GRADE: 0 %
CHL CUP STRESS STAGE 2 HR: 102 {beats}/min
CHL CUP STRESS STAGE 4 GRADE: 10 %
CHL CUP STRESS STAGE 4 HR: 113 {beats}/min
CHL CUP STRESS STAGE 4 SBP: 158 mmHg
CHL CUP STRESS STAGE 4 SPEED: 1.7 mph
CHL CUP STRESS STAGE 5 DBP: 71 mmHg
CHL CUP STRESS STAGE 5 GRADE: 12 %
CHL CUP STRESS STAGE 5 HR: 131 {beats}/min
CHL CUP STRESS STAGE 6 DBP: 70 mmHg
CHL CUP STRESS STAGE 6 SBP: 209 mmHg
CHL CUP STRESS STAGE 6 SPEED: 3.4 mph
CHL CUP STRESS STAGE 7 GRADE: 14 %
CHL CUP STRESS STAGE 8 HR: 134 {beats}/min
CHL CUP STRESS STAGE 9 GRADE: 0 %
CHL CUP STRESS STAGE 9 HR: 100 {beats}/min
CHL RATE OF PERCEIVED EXERTION: 16
CSEPPMHR: 88 %
Estimated workload: 10.1 METS
Exercise duration (min): 9 min
Exercise duration (sec): 0 s
MPHR: 173 {beats}/min
Peak HR: 153 {beats}/min
Percent HR: 88 %
Stage 1 HR: 85 {beats}/min
Stage 1 SBP: 132 mmHg
Stage 1 Speed: 0 mph
Stage 2 Grade: 0 %
Stage 2 Speed: 1 mph
Stage 3 Grade: 0.1 %
Stage 3 HR: 102 {beats}/min
Stage 3 Speed: 1 mph
Stage 4 DBP: 71 mmHg
Stage 5 SBP: 180 mmHg
Stage 5 Speed: 2.5 mph
Stage 6 Grade: 14 %
Stage 6 HR: 151 {beats}/min
Stage 7 HR: 153 {beats}/min
Stage 7 Speed: 3.3 mph
Stage 8 DBP: 54 mmHg
Stage 8 Grade: 0 %
Stage 8 SBP: 218 mmHg
Stage 8 Speed: 0 mph
Stage 9 DBP: 72 mmHg
Stage 9 SBP: 138 mmHg
Stage 9 Speed: 0 mph

## 2016-04-04 LAB — LIPID PANEL
CHOL/HDL RATIO: 4.4 ratio (ref <=5.0)
Cholesterol: 155 mg/dL (ref 125–200)
HDL: 35 mg/dL — AB (ref >=40)
LDL Cholesterol: 91 mg/dL (ref <130)
Triglycerides: 144 mg/dL (ref <150)
VLDL: 29 mg/dL (ref <30)

## 2016-04-04 LAB — HEPATIC FUNCTION PANEL
ALK PHOS: 58 U/L (ref 40–115)
ALT: 50 U/L — AB (ref 9–46)
AST: 37 U/L (ref 10–40)
Albumin: 4.2 g/dL (ref 3.6–5.1)
BILIRUBIN DIRECT: 0.2 mg/dL (ref <=0.2)
BILIRUBIN INDIRECT: 0.7 mg/dL (ref 0.2–1.2)
Total Bilirubin: 0.9 mg/dL (ref 0.2–1.2)
Total Protein: 7.1 g/dL (ref 6.1–8.1)

## 2016-04-04 NOTE — Addendum Note (Signed)
Addended by: Channing MuttersGRACE, Jakob Kimberlin J on: 04/04/2016 09:36 AM   Modules accepted: Orders

## 2016-04-09 NOTE — Anesthesia Preprocedure Evaluation (Signed)
Anesthesia Evaluation  Patient identified by MRN, date of birth, ID band Patient awake    Reviewed: Allergy & Precautions, H&P , NPO status , Patient's Chart, lab work & pertinent test results  Airway Mallampati: II  TM Distance: >3 FB Neck ROM: full    Dental no notable dental hx. (+) Dental Advisory Given, Teeth Intact   Pulmonary neg pulmonary ROS, former smoker,    Pulmonary exam normal breath sounds clear to auscultation       Cardiovascular hypertension, Pt. on medications Normal cardiovascular exam Rhythm:regular Rate:Normal     Neuro/Psych negative neurological ROS  negative psych ROS   GI/Hepatic negative GI ROS, Neg liver ROS, GERD  Poorly Controlled and Medicated,dysphagia   Endo/Other  negative endocrine ROS  Renal/GU negative Renal ROS  negative genitourinary   Musculoskeletal   Abdominal   Peds  Hematology negative hematology ROS (+)   Anesthesia Other Findings   Reproductive/Obstetrics negative OB ROS                             Anesthesia Physical Anesthesia Plan  ASA: III  Anesthesia Plan: MAC   Post-op Pain Management:    Induction:   Airway Management Planned:   Additional Equipment:   Intra-op Plan:   Post-operative Plan:   Informed Consent: I have reviewed the patients History and Physical, chart, labs and discussed the procedure including the risks, benefits and alternatives for the proposed anesthesia with the patient or authorized representative who has indicated his/her understanding and acceptance.   Dental Advisory Given  Plan Discussed with: CRNA  Anesthesia Plan Comments:         Anesthesia Quick Evaluation

## 2016-04-10 ENCOUNTER — Encounter (HOSPITAL_COMMUNITY)
Admission: RE | Disposition: A | Discharge status: Home / Self Care | Payer: Self-pay | Source: Ambulatory Visit | Attending: Gastroenterology

## 2016-04-10 ENCOUNTER — Ambulatory Visit (HOSPITAL_COMMUNITY): Payer: 59 | Admitting: Anesthesiology

## 2016-04-10 ENCOUNTER — Encounter (HOSPITAL_COMMUNITY): Payer: Self-pay

## 2016-04-10 ENCOUNTER — Ambulatory Visit (HOSPITAL_COMMUNITY)
Admission: RE | Admit: 2016-04-10 | Discharge: 2016-04-10 | Disposition: A | Discharge status: Home / Self Care | Payer: 59 | Source: Ambulatory Visit | Attending: Gastroenterology | Admitting: Gastroenterology

## 2016-04-10 DIAGNOSIS — K219 Gastro-esophageal reflux disease without esophagitis: Secondary | ICD-10-CM | POA: Insufficient documentation

## 2016-04-10 DIAGNOSIS — Z79899 Other long term (current) drug therapy: Secondary | ICD-10-CM | POA: Diagnosis not present

## 2016-04-10 DIAGNOSIS — K317 Polyp of stomach and duodenum: Secondary | ICD-10-CM | POA: Diagnosis not present

## 2016-04-10 DIAGNOSIS — R1310 Dysphagia, unspecified: Secondary | ICD-10-CM | POA: Diagnosis present

## 2016-04-10 DIAGNOSIS — M109 Gout, unspecified: Secondary | ICD-10-CM | POA: Insufficient documentation

## 2016-04-10 DIAGNOSIS — Z87891 Personal history of nicotine dependence: Secondary | ICD-10-CM | POA: Insufficient documentation

## 2016-04-10 DIAGNOSIS — I10 Essential (primary) hypertension: Secondary | ICD-10-CM | POA: Insufficient documentation

## 2016-04-10 HISTORY — PX: ESOPHAGOGASTRODUODENOSCOPY (EGD) WITH PROPOFOL: SHX5813

## 2016-04-10 SURGERY — ESOPHAGOGASTRODUODENOSCOPY (EGD) WITH PROPOFOL
Anesthesia: Monitor Anesthesia Care

## 2016-04-10 MED ORDER — ONDANSETRON HCL 4 MG/2ML IJ SOLN
INTRAMUSCULAR | Status: AC
  Filled 2016-04-10: qty 2

## 2016-04-10 MED ORDER — PROPOFOL 10 MG/ML IV BOLUS
INTRAVENOUS | Status: AC
  Filled 2016-04-10: qty 20

## 2016-04-10 MED ORDER — LACTATED RINGERS IV SOLN
INTRAVENOUS | Status: DC
  Administered 2016-04-10: 08:00:00 via INTRAVENOUS

## 2016-04-10 MED ORDER — LIDOCAINE HCL (CARDIAC) 20 MG/ML IV SOLN
INTRAVENOUS | Status: AC
  Filled 2016-04-10: qty 5

## 2016-04-10 MED ORDER — SODIUM CHLORIDE 0.9 % IV SOLN: INTRAVENOUS | Status: DC

## 2016-04-10 MED ORDER — PROPOFOL 500 MG/50ML IV EMUL
INTRAVENOUS | Status: DC | PRN
  Administered 2016-04-10: 300 ug/kg/min via INTRAVENOUS

## 2016-04-10 MED ORDER — LIDOCAINE HCL (CARDIAC) 20 MG/ML IV SOLN
INTRAVENOUS | Status: DC | PRN
  Administered 2016-04-10: 80 mg via INTRAVENOUS
  Administered 2016-04-10: 20 mg via INTRAVENOUS

## 2016-04-10 MED ORDER — PROPOFOL 10 MG/ML IV BOLUS
INTRAVENOUS | Status: AC
  Filled 2016-04-10: qty 40

## 2016-04-10 MED ORDER — PROPOFOL 10 MG/ML IV BOLUS
INTRAVENOUS | Status: DC | PRN
  Administered 2016-04-10: 20 mg via INTRAVENOUS
  Administered 2016-04-10: 30 mg via INTRAVENOUS
  Administered 2016-04-10: 40 mg via INTRAVENOUS
  Administered 2016-04-10: 140 mg via INTRAVENOUS

## 2016-04-10 MED ORDER — ONDANSETRON HCL 4 MG/2ML IJ SOLN
INTRAMUSCULAR | Status: DC | PRN
  Administered 2016-04-10: 4 mg via INTRAVENOUS

## 2016-04-10 SURGICAL SUPPLY — 15 items

## 2016-04-10 NOTE — H&P (Signed)
  Problems: Heartburn with esophageal dysphagia. 05/23/2005 esophagogastroduodenoscopy with distal esophageal ring dilation was performed. 02/06/2011 normal barium upper GI with small bowel follow-through x-ray series was performed. 10/11/2011 abdominal ultrasound showed hepatosplenomegaly with fatty appearing liver. Chronically elevated ALT measurement.  History: The patient is a 45 year old male born [birth year]. He underwent a cardiac evaluation by Dr. Carolanne Grumblingracy Turner to evaluate pressure-like chest discomfort.  He has also been experiencing intermittent liquid and solid food esophageal dysphagia with heartburn but no odynophagia. He was prescribed Nexium twice daily and states that he no longer has heartburn and his esophageal dysphagia has become less frequent.  The patient is scheduled to undergo diagnostic esophagogastroduodenoscopy with possible esophageal stricture dilation, screen for esophagogastric varices, and possible screen for eosinophilic esophagitis.  Past medical history: Anxiety. Hypertension. Gout. Asthma. Hepatic steatosis by abdominal ultrasound. Hepatosplenomegaly by abdominal ultrasound. Chronically elevated ALT measurement. Depression. Tonsillectomy.  Medication allergies: Penicillin cause nausea  Exam: The patient is alert and lying comfortably on the endoscopy stretcher. Abdomen is soft and nontender to palpation. Lungs are clear to auscultation. Cardiac exam reveals a regular rhythm.  Plan: Proceed with diagnostic esophagogastroduodenoscopy, possible esophageal stricture dilation, screen for esophagogastric varices, and possible screen for eosinophilic esophagitis.

## 2016-04-10 NOTE — Op Note (Signed)
[facility] Patient Name: Alexander Joseph Procedure Date: 04/10/2016 MRN: [id number] Attending MD: Charolett Bumpers , MD Date of Birth: 1971/12/05 CSN:  Age: 45 Admit Type: Outpatient Procedure:                Upper GI endoscopy Indications:              Dysphagia, Heartburn Providers:                Charolett Bumpers, MD, Omelia Blackwater, RN, Darletta Moll, Technician, Clearnce Sorrel, Technician Referring MD:              Medicines:                Propofol per Anesthesia Complications:            No immediate complications. Estimated Blood Loss:     Estimated blood loss: none. Procedure:                Pre-Anesthesia Assessment:                           - Prior to the procedure, a History and Physical                            was performed, and patient medications and                            allergies were reviewed. The patient's tolerance of                            previous anesthesia was also reviewed. The risks                            and benefits of the procedure and the sedation                            options and risks were discussed with the patient.                            All questions were answered, and informed consent                            was obtained. Prior Anticoagulants: The patient has                            taken ibuprofen, last dose was 7 days prior to                            procedure. ASA Grade Assessment: II - A patient                            with mild systemic disease. After reviewing the  risks and benefits, the patient was deemed in                            satisfactory condition to undergo the procedure.                           After obtaining informed consent, the endoscope was                            passed under direct vision. Throughout the                            procedure, the patient's blood pressure, pulse, and   oxygen saturations were monitored continuously. The                            EG-[id number] ([id number]) scope was introduced through the                            mouth, and advanced to the second part of duodenum.                            The upper GI endoscopy was accomplished without                            difficulty. The patient tolerated the procedure                            well. Scope In: Scope Out: Findings:      The Z-line was regular and was found 45 cm from the incisors. No       esophageal varices or Barrett appearing mucosa.      The examined esophagus was normal. Biopsies were taken with a cold       forceps for histology R/O eosinophilic esophagitis.      A single 5 mm sessile polyp with no bleeding and no stigmata of recent       bleeding was found on the lesser curvature of the stomach. The polyp was       removed with a cold snare. Resection and retrieval were complete.      The examined duodenum was normal. Impression:               - Z-line regular, 45 cm from the incisors.                           - Normal esophagus. Biopsied.                           - A single gastric polyp. Resected and retrieved.                           - Normal examined duodenum. Moderate Sedation:      N/A- Per Anesthesia Care Recommendation:           - Await pathology results.                           -  Patient has a contact number available for                            emergencies. The signs and symptoms of potential                            delayed complications were discussed with the                            patient. Return to normal activities tomorrow.                            Written discharge instructions were provided to the                            patient.                           - Resume previous diet.                           - Continue present medications. Procedure Code(s):        --- Professional ---                           [phone],  Esophagogastroduodenoscopy, flexible,                            transoral; with removal of tumor(s), polyp(s), or                            other lesion(s) by snare technique Diagnosis Code(s):        --- Professional ---                           K31.7, Polyp of stomach and duodenum                           R13.10, Dysphagia, unspecified                           R12, Heartburn CPT copyright 2016 [employer]. All rights reserved. The codes documented in this report are preliminary and upon coder review may  be revised to meet current compliance requirements. Danise EdgeMartin Aisling Emigh, MD Charolett BumpersMartin K Nyari Olsson, MD 04/10/2016 8:07:18 AM This report has been signed electronically. Number of Addenda: 0

## 2016-04-10 NOTE — Anesthesia Postprocedure Evaluation (Signed)
Anesthesia Post Note  Patient: Alexander Joseph  Procedure(s) Performed: Procedure(s) (LRB): ESOPHAGOGASTRODUODENOSCOPY (EGD) WITH PROPOFOL w/ Dialation (N/A)  Patient location during evaluation: PACU Anesthesia Type: MAC Level of consciousness: awake and alert Pain management: pain level controlled Vital Signs Assessment: post-procedure vital signs reviewed and stable Respiratory status: spontaneous breathing, nonlabored ventilation, respiratory function stable and patient connected to nasal cannula oxygen Cardiovascular status: blood pressure returned to baseline and stable Postop Assessment: no signs of nausea or vomiting Anesthetic complications: no    Last Vitals:  Filed Vitals:   04/10/16 0810 04/10/16 0820  BP: 134/79 140/94  Pulse: 78 69  Temp:    Resp: 14 17    Last Pain: There were no vitals filed for this visit.               Camilia Caywood L

## 2016-04-10 NOTE — Transfer of Care (Signed)
Immediate Anesthesia Transfer of Care Note  Patient: Alexander RayaDavid M Rozenberg  Procedure(s) Performed: Procedure(s): ESOPHAGOGASTRODUODENOSCOPY (EGD) WITH PROPOFOL w/ Dialation (N/A)  Patient Location: PACU  Anesthesia Type:MAC  Level of Consciousness:  sedated, patient cooperative and responds to stimulation  Airway & Oxygen Therapy:Patient Spontanous Breathing and Patient connected to nasal oxgen  Post-op Assessment:  Report given to PACU RN and Post -op Vital signs reviewed and stable  Post vital signs:  Reviewed and stable  Last Vitals:  Filed Vitals:   04/10/16 0659  BP: 147/80  Pulse: 87  Temp: 36.7 C  Resp: 15    Complications: No apparent anesthesia complications

## 2016-04-10 NOTE — Discharge Instructions (Signed)

## 2016-04-11 ENCOUNTER — Encounter: Payer: Self-pay | Admitting: Cardiology

## 2016-04-11 ENCOUNTER — Encounter (HOSPITAL_COMMUNITY): Payer: Self-pay | Admitting: Gastroenterology

## 2016-04-11 NOTE — Telephone Encounter (Signed)
New Message  Pt returned call. He believes that it may be for the CT results. Please call back to discuss

## 2016-04-11 NOTE — Telephone Encounter (Signed)
This encounter was created in error - please disregard.

## 2016-04-25 ENCOUNTER — Telehealth: Payer: Self-pay | Admitting: Cardiology

## 2016-04-25 DIAGNOSIS — R931 Abnormal findings on diagnostic imaging of heart and coronary circulation: Secondary | ICD-10-CM

## 2016-04-25 NOTE — Telephone Encounter (Signed)
Informed patient of results and verbal understanding expressed.  Cardiac MRI ordered for scheduling Patient agrees with treatment plan. 

## 2016-04-25 NOTE — Telephone Encounter (Signed)
New message     Patient returning the nurse called - test results

## 2016-04-25 NOTE — Telephone Encounter (Signed)
-----   Message from Quintella Reichertraci R Turner, MD sent at 04/10/2016  9:06 AM EDT ----- Calcium score was 0 so low risk for cardiac disease.  ? Pericardial cyst - please get cardiac MRI for further evaluation

## 2016-05-11 ENCOUNTER — Telehealth: Payer: Self-pay | Admitting: Cardiology

## 2016-05-11 ENCOUNTER — Encounter: Payer: Self-pay | Admitting: Cardiology

## 2016-05-11 NOTE — Telephone Encounter (Signed)
Called and left a voicemail for the patient with date, time and place of cardiac MRI.  Letter mailed today.  Left my number if any questions.

## 2016-05-19 ENCOUNTER — Ambulatory Visit (HOSPITAL_COMMUNITY)
Admission: RE | Admit: 2016-05-19 | Discharge: 2016-05-19 | Disposition: A | Discharge status: Home / Self Care | Payer: 59 | Source: Ambulatory Visit | Attending: Cardiology | Admitting: Cardiology

## 2016-05-19 DIAGNOSIS — R938 Abnormal findings on diagnostic imaging of other specified body structures: Secondary | ICD-10-CM | POA: Diagnosis not present

## 2016-05-19 DIAGNOSIS — R931 Abnormal findings on diagnostic imaging of heart and coronary circulation: Secondary | ICD-10-CM

## 2016-05-19 DIAGNOSIS — I318 Other specified diseases of pericardium: Secondary | ICD-10-CM | POA: Insufficient documentation

## 2016-05-19 LAB — CREATININE, SERUM
CREATININE: 1.1 mg/dL (ref 0.61–1.24)
GFR calc Af Amer: >60 mL/min (ref >60)
GFR calc non Af Amer: >60 mL/min (ref >60)

## 2016-05-19 MED ORDER — GADOBENATE DIMEGLUMINE 529 MG/ML IV SOLN
37.0000 mL | Freq: Once | INTRAVENOUS | Status: AC | PRN
  Administered 2016-05-19: 37 mL via INTRAVENOUS

## 2016-05-25 ENCOUNTER — Telehealth: Payer: Self-pay

## 2016-05-25 DIAGNOSIS — Q248 Other specified congenital malformations of heart: Secondary | ICD-10-CM

## 2016-05-25 NOTE — Telephone Encounter (Signed)
-----   Message from Quintella Reichertraci R Turner, MD sent at 05/21/2016 10:37 PM EDT ----- Please let patient know that cardia MRI showed nornal chambers and valves.  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  ? Whether this is etiology of patients pain.  Please refer to Dr. Laneta SimmersBartle for further evaluation.

## 2016-05-25 NOTE — Telephone Encounter (Signed)
Informed patient of results and verbal understanding expressed.  Referral to Dr. Laneta SimmersBartle placed for scheduling. Patient agrees with treatment plan.

## 2016-06-07 ENCOUNTER — Encounter: Payer: 59 | Admitting: Surgery

## 2016-06-15 ENCOUNTER — Encounter: Payer: 59 | Admitting: Surgery

## 2016-07-12 ENCOUNTER — Institutional Professional Consult (permissible substitution) (INDEPENDENT_AMBULATORY_CARE_PROVIDER_SITE_OTHER): Payer: 59 | Admitting: Surgery

## 2016-07-12 ENCOUNTER — Encounter: Payer: Self-pay | Admitting: Surgery

## 2016-07-12 VITALS — BP 136/88 | HR 88 | Resp 20 | Ht 75.0 in | Wt 256.0 lb

## 2016-07-12 DIAGNOSIS — Q249 Congenital malformation of heart, unspecified: Secondary | ICD-10-CM

## 2016-07-12 DIAGNOSIS — Q248 Other specified congenital malformations of heart: Secondary | ICD-10-CM

## 2016-07-14 ENCOUNTER — Encounter: Payer: Self-pay | Admitting: Surgery

## 2016-07-14 NOTE — Progress Notes (Signed)
Cardiothoracic Surgery Consultation  PCP is Redmond BasemanWONG,FRANCIS PATRICK, MD Referring Provider is Quintella Reicherturner, Traci R, MD  Chief Complaint  Patient presents with  . Cyst    Surgical eval on pericardial cyst, Cardiac MRI 05/19/2016    HPI:  The patient is a 45 year old gentleman who has had pain in his left chest and back since March. He was seen in the ER and ruled out for MI. An echo was unremarkable and ETT negative for ischemia. A CT for cardiac scoring showed a probable small pericardial cyst lateral to the RAA. A cardiac MR on 5/26 showed a mass in the pericardial space adjacent to the RAA measuring 32 x 16 x 40 mm and consistent with a pericardial cyst. It is no invading any surrounding tissue or causing any obstruction to flow in the SVC or RA. There is no compression of any surrounding structures. He underwent an EGD that showed only a small gastric polyp and no cause for his pain.   He says that he continue to have a pressure type pain in his left upper back over his scapula that sometimes goes through to the anterior chest. Past Medical History  Diagnosis Date  . Hypertension   . Esophageal stricture   . Anxiety state, unspecified   . Gout   . Dysphagia   . 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 Asthma     younger    Past Surgical History  Procedure Laterality Date  . Upper gi endoscopy    . Esophageal dilation    . Tonsillectomy    . Esophagogastroduodenoscopy (egd) with propofol N/A 04/10/2016    Procedure: ESOPHAGOGASTRODUODENOSCOPY (EGD) WITH PROPOFOL w/ Dialation;  Surgeon: Charolett BumpersMartin K Johnson, MD;  Location: [facility];  Service: Endoscopy;  Laterality: N/A;    Family History  Problem Relation Age of Onset  . Heart disease Father   . Gout Father   . Heart attack Father   . Obesity Mother   . Osteoarthritis Mother   . Colon cancer Neg Hx   . Breast cancer Neg Hx   . Prostate cancer Neg Hx   . Asthma      Social History Social History  Substance Use Topics  . Smoking  status: Former Smoker -- 10 years    Types: Cigarettes    Quit date: 04/03/2002  . Smokeless tobacco: None  . Alcohol Use: 25.2 oz/week    42 Cans of beer per week     Comment: beer daily    Current Outpatient Prescriptions  Medication Sig Dispense Refill  . esomeprazole (NEXIUM) 20 MG capsule Take 20 mg by mouth 2 (two) times daily.    Marland Kitchen. lisinopril (PRINIVIL,ZESTRIL) 10 MG tablet Take 10 mg by mouth daily.  0  . loratadine (CLARITIN) 10 MG tablet Take 10 mg by mouth daily.    Marland Kitchen. VIAGRA 100 MG tablet Take 100 mg by mouth as needed for erectile dysfunction.   6  . allopurinol (ZYLOPRIM) 100 MG tablet Take 100 mg by mouth as needed (For Gout). Reported on 07/12/2016    . fluticasone (FLONASE) 50 MCG/ACT nasal spray Place 1 spray into both nostrils daily as needed for allergies or rhinitis. Reported on 07/12/2016    . ibuprofen (ADVIL,MOTRIN) 800 MG tablet Take 800 mg by mouth 3 (three) times daily as needed for moderate pain. Reported on 07/12/2016  0   No current facility-administered medications for this visit.    Allergies  Allergen Reactions  . Dust Mite Extract Itching  .  Penicillins Nausea Only    Review of Systems  Constitutional: Negative for fever, chills and appetite change.  Respiratory: Negative for shortness of breath.   Cardiovascular: Positive for chest pain.  Gastrointestinal: Positive for nausea, vomiting and abdominal pain.  Musculoskeletal: Positive for back pain. Negative for arthralgias and neck pain.  Neurological: Negative.   Psychiatric/Behavioral: Negative.     BP 136/88 mmHg  Pulse 88  Resp 20  Ht  (1.905 m)  Wt 256 lb (116.121 kg)  BMI 32.00 kg/m2  SpO2 98% Physical Exam  Constitutional: He is oriented to person, place, and time. He appears well-developed and well-nourished. No distress.  HENT:  Head: Normocephalic and atraumatic.  Mouth/Throat: Oropharynx is clear and moist.  Eyes: EOM are normal. Pupils are equal, round, and reactive to  light.  Neck: Normal range of motion. Neck supple. No JVD present. No thyromegaly present.  Cardiovascular: Normal rate, regular rhythm and normal heart sounds.   No murmur heard. Pulmonary/Chest: Effort normal and breath sounds normal. No respiratory distress. He has no wheezes. He has no rales. He exhibits no tenderness.  Abdominal: Soft. Bowel sounds are normal. He exhibits no distension and no mass. There is no tenderness.  Musculoskeletal: He exhibits no edema or tenderness.  Lymphadenopathy:    He has no cervical adenopathy.  Neurological: He is alert and oriented to person, place, and time.  Skin: Skin is warm and dry.  Psychiatric: He has a normal mood and affect.     Diagnostic Tests:  CLINICAL DATA: 45 year old male with chest pain and finding suspicious of pericardial cyst on chest CTA.  EXAM: CARDIAC MRI  TECHNIQUE: The patient was scanned on a 1.5 Tesla GE magnet. A dedicated cardiac coil was used. Functional imaging was done using Fiesta sequences. 2,3, and 4 chamber views were done to assess for RWMA's. Modified Simpson's rule using a short axis stack was used to calculate an ejection fraction on a dedicated work [employer]. The patient received 37 cc of Multihance. After 10 minutes inversion recovery sequences were used to assess for infiltration and scar tissue.  CONTRAST: 37 cc of Multihance  FINDINGS: 1. Normal left ventricular size, thickness and systolic function (LVEF = 60-65%) with no regional wall motion abnormalities.  2. Normal right ventricular size, thickness and systolic function.  3. Normal biatrial size.  4. No significant valvular abnormalities.  5. Normal size of the aortic root, ascending aorta and pulmonary artery.  6. There is a mass located in the pericardial space, adjacent to the atrial appendage, right atrium and anterior to the SVC. The mass is well circumscribed, filled with fluid, not  vascularized and neither invading into any surrounding tissue nor obstructing flow in the SVC or right atrium. The tissue characteristics are consistent with a pericardial cyst. The cyst measures 32 mm in the anteroposterior direction, 16 mm in the right to left direction and 40 mm in the superoinferior direction.  IMPRESSION: 1. Normal left ventricular size, thickness and systolic function (LVEF = 60-65%) with no regional wall motion abnormalities.  2. Normal right ventricular size, thickness and systolic function.  3. Normal biatrial size.  4. No significant valvular abnormalities.  5. Normal size of the aortic root, ascending aorta and pulmonary artery.  6. 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 The cyst is neither invading into any surrounding tissue nor obstructing flow in  the SVC or right atrium.  A follow up scan in 1 year is recommended to monitor growth.  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 I don't think this is the cause of his left upper back pain. It is unusual for pericardial cysts to cause symptoms unless large and usually that is pain or pressure in the general area that it is located, most commonly along the right anterior cardiophrenic space. I don't think there is any indication to resect this cyst since they generally run a benign asymptomatic course and this one is small. I would do a follow up study in a year to be sure it is stable. It is not clear what is causing his pain but it sounds more musculoskeletal than anything else. There is nothing else abnormal on his MRI.  Plan:  He will continue to follow up with his PCP and Dr. Mayford Knife.  Alleen Borne, MD [facility] [phone]

## 2017-08-02 IMAGING — CR DG CHEST 2V
2 series · 2 of 2 positions shown · non-contrast
Comparison: 03/13/2012

CLINICAL DATA: Chest pain

EXAM:
CHEST  2 VIEW

[chest pa]
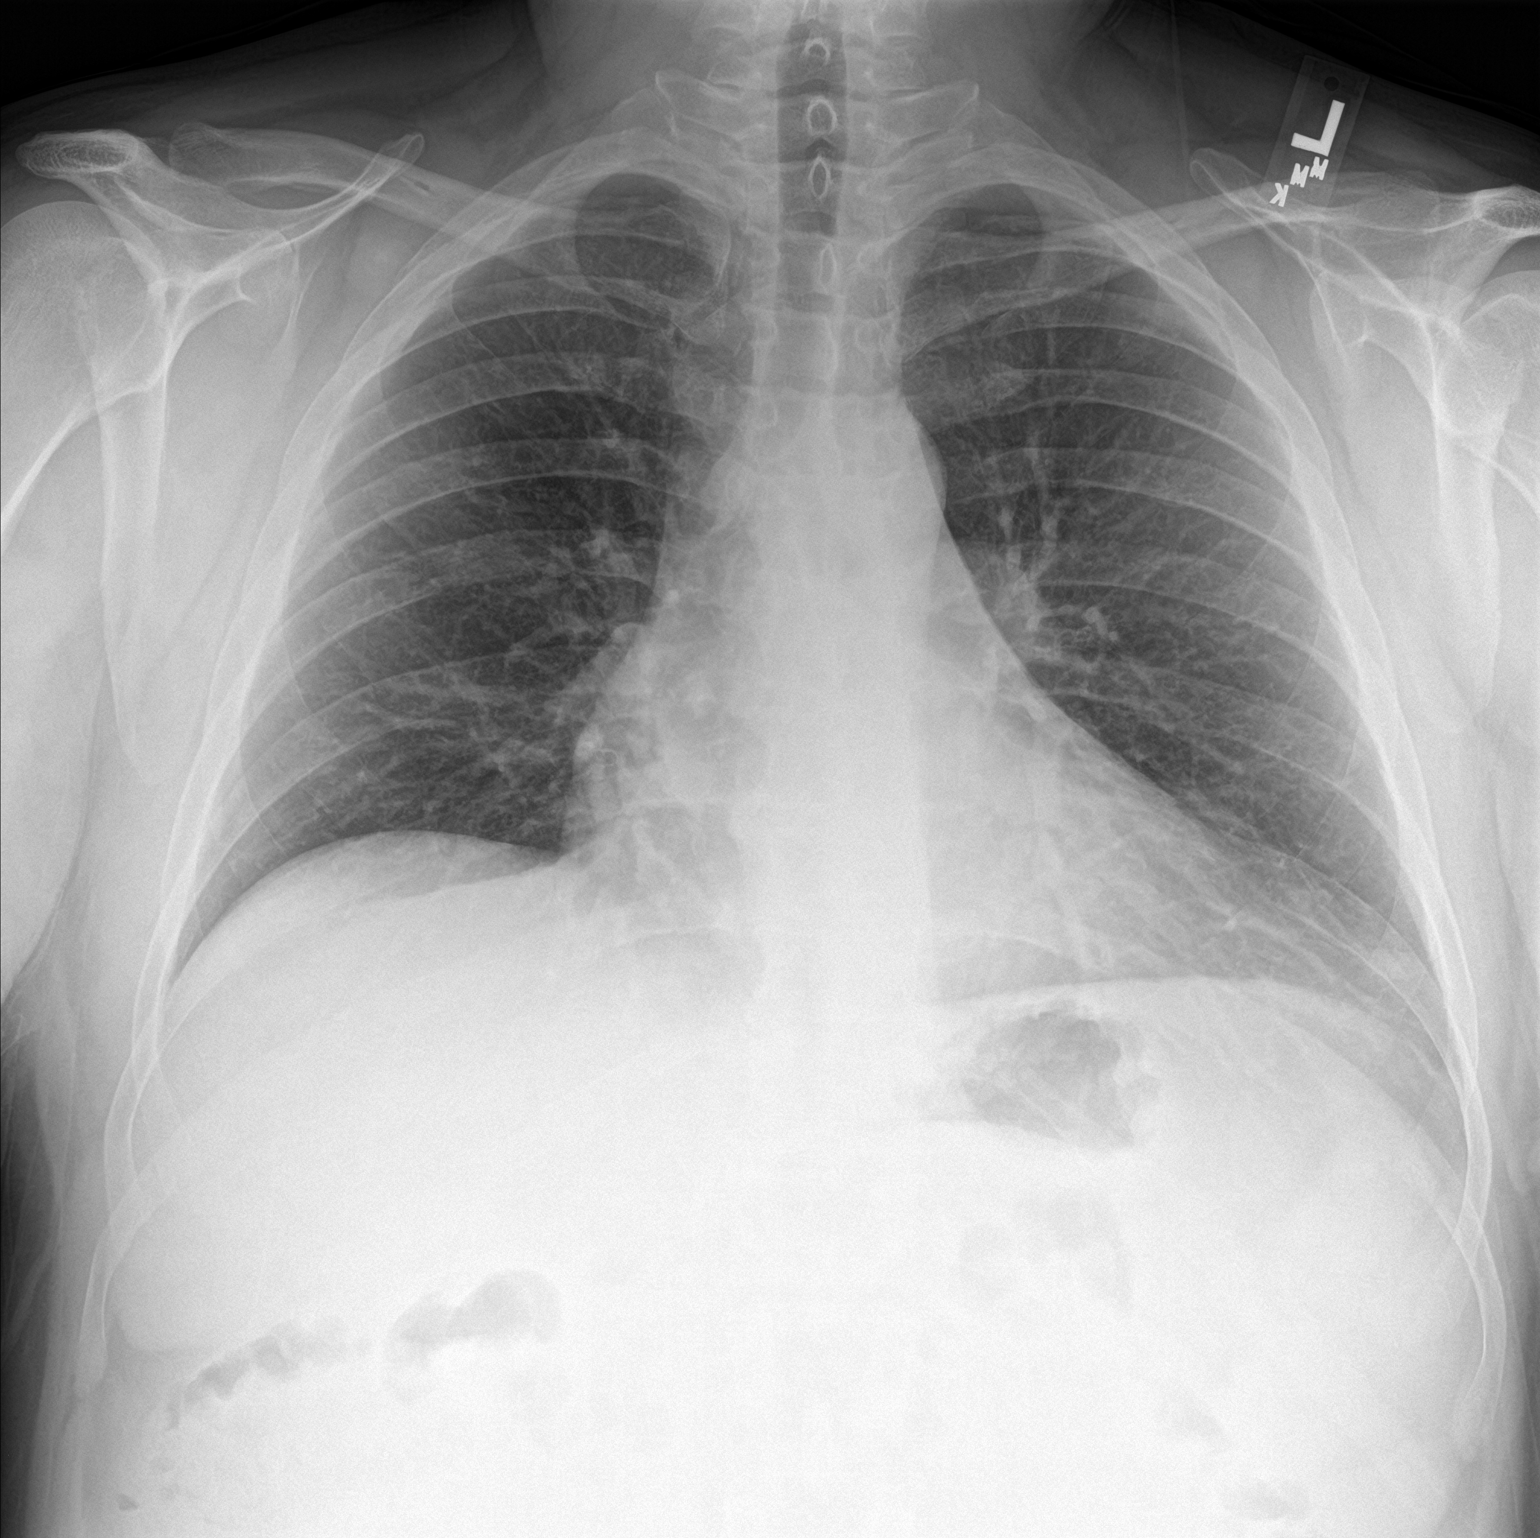

[chest lat]
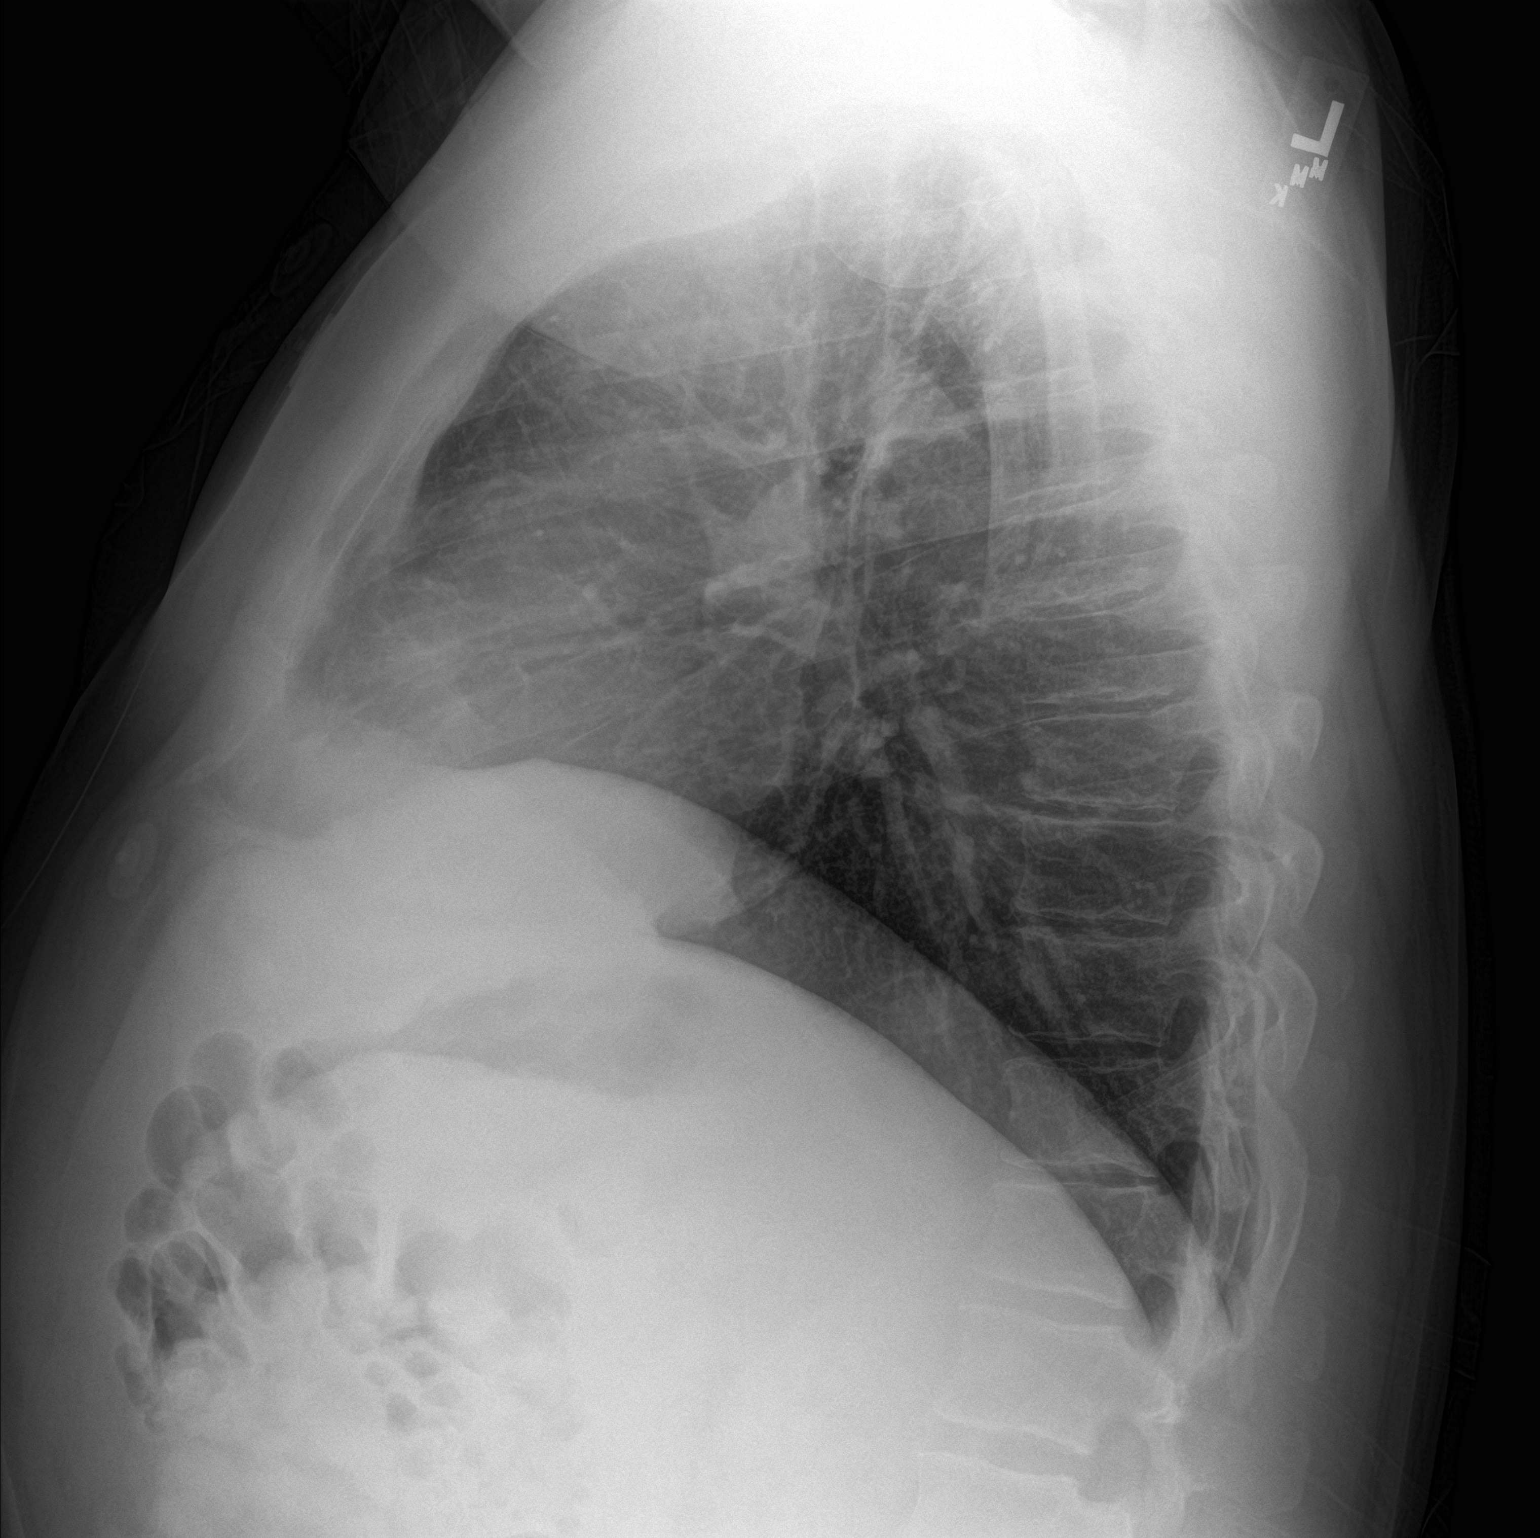

[2 of 2 positions shown; findings below may reference images not displayed]

FINDINGS: The heart size and mediastinal contours are within normal limits.
Both lungs are clear. The visualized skeletal structures are
unremarkable.
IMPRESSION: No active cardiopulmonary disease.

## 2017-11-09 DIAGNOSIS — Z0000 Encounter for general adult medical examination without abnormal findings: Secondary | ICD-10-CM | POA: Diagnosis not present

## 2017-11-09 DIAGNOSIS — I10 Essential (primary) hypertension: Secondary | ICD-10-CM | POA: Diagnosis not present

## 2017-11-09 DIAGNOSIS — Z23 Encounter for immunization: Secondary | ICD-10-CM | POA: Diagnosis not present

## 2017-11-14 DIAGNOSIS — Z0100 Encounter for examination of eyes and vision without abnormal findings: Secondary | ICD-10-CM | POA: Diagnosis not present

## 2018-01-31 DIAGNOSIS — M722 Plantar fascial fibromatosis: Secondary | ICD-10-CM | POA: Diagnosis not present

## 2018-01-31 DIAGNOSIS — M10072 Idiopathic gout, left ankle and foot: Secondary | ICD-10-CM | POA: Diagnosis not present

## 2018-02-08 DIAGNOSIS — M79672 Pain in left foot: Secondary | ICD-10-CM | POA: Diagnosis not present

## 2018-03-08 DIAGNOSIS — M76822 Posterior tibial tendinitis, left leg: Secondary | ICD-10-CM | POA: Diagnosis not present

## 2018-03-08 DIAGNOSIS — M79672 Pain in left foot: Secondary | ICD-10-CM | POA: Diagnosis not present

## 2018-05-10 DIAGNOSIS — N529 Male erectile dysfunction, unspecified: Secondary | ICD-10-CM | POA: Diagnosis not present

## 2018-05-10 DIAGNOSIS — I10 Essential (primary) hypertension: Secondary | ICD-10-CM | POA: Diagnosis not present

## 2018-05-10 DIAGNOSIS — S61230A Puncture wound without foreign body of right index finger without damage to nail, initial encounter: Secondary | ICD-10-CM | POA: Diagnosis not present

## 2018-05-10 DIAGNOSIS — Z23 Encounter for immunization: Secondary | ICD-10-CM | POA: Diagnosis not present

## 2018-11-12 DIAGNOSIS — Z13220 Encounter for screening for lipoid disorders: Secondary | ICD-10-CM | POA: Diagnosis not present

## 2018-11-12 DIAGNOSIS — I10 Essential (primary) hypertension: Secondary | ICD-10-CM | POA: Diagnosis not present

## 2018-11-12 DIAGNOSIS — Z0000 Encounter for general adult medical examination without abnormal findings: Secondary | ICD-10-CM | POA: Diagnosis not present

## 2018-11-26 DIAGNOSIS — Z0100 Encounter for examination of eyes and vision without abnormal findings: Secondary | ICD-10-CM | POA: Diagnosis not present

## 2020-08-18 ENCOUNTER — Other Ambulatory Visit: Payer: Self-pay | Admitting: Oncology

## 2020-08-18 DIAGNOSIS — U071 COVID-19: Secondary | ICD-10-CM

## 2020-08-18 NOTE — Progress Notes (Signed)
I connected by phone with  Alexander Joseph to discuss the potential use of an new treatment for mild to moderate COVID-19 viral infection in non-hospitalized patients.   This patient is a age/sex that meets the FDA criteria for Emergency Use Authorization of casirivimab\imdevimab.  Has a (+) direct SARS-CoV-2 viral test result 1. Has mild or moderate COVID-19  2. Is ? 51 years of age and weighs ? 40 kg 3. Is NOT hospitalized due to COVID-19 4. Is NOT requiring oxygen therapy or requiring an increase in baseline oxygen flow rate due to COVID-19 5. Is within 10 days of symptom onset 6. Has at least one of the high risk factor(s) for progression to severe COVID-19 and/or hospitalization as defined in EUA. ? Specific high risk criteria :HTN   Symptom onset  08/16/20.    I have spoken and communicated the following to the patient or parent/caregiver:   1. FDA has authorized the emergency use of casirivimab\imdevimab for the treatment of mild to moderate COVID-19 in adults and pediatric patients with positive results of direct SARS-CoV-2 viral testing who are 65 years of age and older weighing at least 40 kg, and who are at high risk for progressing to severe COVID-19 and/or hospitalization.   2. The significant known and potential risks and benefits of casirivimab\imdevimab, and the extent to which such potential risks and benefits are unknown.   3. Information on available alternative treatments and the risks and benefits of those alternatives, including clinical trials.   4. Patients treated with casirivimab\imdevimab should continue to self-isolate and use infection control measures (e.g., wear mask, isolate, social distance, avoid sharing personal items, clean and disinfect "high touch" surfaces, and frequent handwashing) according to CDC guidelines.    5. The patient or parent/caregiver has the option to accept or refuse casirivimab\imdevimab .   After reviewing this information with the  patient, The patient agreed to proceed with receiving casirivimab\imdevimab infusion and will be provided a copy of the Fact sheet prior to receiving the infusion.Mignon Pine, AGNP-C [phone] (Infusion Center Hotline)

## 2020-08-19 ENCOUNTER — Ambulatory Visit (HOSPITAL_COMMUNITY)
Admission: RE | Admit: 2020-08-19 | Discharge: 2020-08-19 | Disposition: A | Discharge status: Home / Self Care | Payer: 59 | Source: Ambulatory Visit | Attending: Pulmonary Disease | Admitting: Pulmonary Disease

## 2020-08-19 DIAGNOSIS — U071 COVID-19: Secondary | ICD-10-CM | POA: Diagnosis present

## 2020-08-19 MED ORDER — METHYLPREDNISOLONE SODIUM SUCC 125 MG IJ SOLR: 125.0000 mg | Freq: Once | INTRAMUSCULAR | Status: DC | PRN

## 2020-08-19 MED ORDER — DIPHENHYDRAMINE HCL 50 MG/ML IJ SOLN: 50.0000 mg | Freq: Once | INTRAMUSCULAR | Status: DC | PRN

## 2020-08-19 MED ORDER — FAMOTIDINE IN NACL 20-0.9 MG/50ML-% IV SOLN: 20.0000 mg | Freq: Once | INTRAVENOUS | Status: DC | PRN

## 2020-08-19 MED ORDER — SODIUM CHLORIDE 0.9 % IV SOLN
1200.0000 mg | Freq: Once | INTRAVENOUS | Status: AC
  Administered 2020-08-19: 1200 mg via INTRAVENOUS
  Filled 2020-08-19: qty 10

## 2020-08-19 MED ORDER — ALBUTEROL SULFATE HFA 108 (90 BASE) MCG/ACT IN AERS: 2.0000 | INHALATION_SPRAY | Freq: Once | RESPIRATORY_TRACT | Status: DC | PRN

## 2020-08-19 MED ORDER — EPINEPHRINE 0.3 MG/0.3ML IJ SOAJ: 0.3000 mg | Freq: Once | INTRAMUSCULAR | Status: DC | PRN

## 2020-08-19 MED ORDER — SODIUM CHLORIDE 0.9 % IV SOLN: INTRAVENOUS | Status: DC | PRN

## 2020-08-19 NOTE — Discharge Instructions (Signed)

## 2020-08-19 NOTE — Progress Notes (Signed)
  Diagnosis: COVID-19  Physician: Dr Patrick Wright  Procedure: [facility] Med: casirivimab\imdevimab infusion - Provided patient with casirivimab\imdevimab fact sheet for patients, parents and caregivers prior to infusion.  Complications: No immediate complications noted.  Discharge: Discharged home   Sparrow Sanzo 08/19/2020  

## 2022-11-20 ENCOUNTER — Encounter (HOSPITAL_BASED_OUTPATIENT_CLINIC_OR_DEPARTMENT_OTHER): Payer: Self-pay

## 2022-11-20 ENCOUNTER — Other Ambulatory Visit (HOSPITAL_BASED_OUTPATIENT_CLINIC_OR_DEPARTMENT_OTHER): Payer: Self-pay | Admitting: Family Medicine

## 2022-11-20 ENCOUNTER — Ambulatory Visit (HOSPITAL_BASED_OUTPATIENT_CLINIC_OR_DEPARTMENT_OTHER)
Admission: RE | Admit: 2022-11-20 | Discharge: 2022-11-20 | Disposition: A | Discharge status: Home / Self Care | Payer: 59 | Source: Ambulatory Visit | Attending: Family Medicine | Admitting: Family Medicine

## 2022-11-20 DIAGNOSIS — R1031 Right lower quadrant pain: Secondary | ICD-10-CM

## 2022-11-20 MED ORDER — IOHEXOL 300 MG/ML  SOLN
100.0000 mL | Freq: Once | INTRAMUSCULAR | Status: AC | PRN
  Administered 2022-11-20: 100 mL via INTRAVENOUS

## 2022-11-21 LAB — POCT I-STAT CREATININE: Creatinine, Ser: 1.1 mg/dL (ref 0.61–1.24)
